# Patient Record
Sex: Male | Born: 1940 | Race: Black or African American | Hispanic: No | Marital: Married | Smoking: Former smoker
Health system: Southern US, Community
[De-identification: ages and names within clinical notes are randomized; demographics above are authoritative.]

## PROBLEM LIST (undated history)

## (undated) DIAGNOSIS — K648 Other hemorrhoids: Secondary | ICD-10-CM

## (undated) DIAGNOSIS — K222 Esophageal obstruction: Secondary | ICD-10-CM

## (undated) DIAGNOSIS — M1990 Unspecified osteoarthritis, unspecified site: Secondary | ICD-10-CM

## (undated) DIAGNOSIS — H269 Unspecified cataract: Secondary | ICD-10-CM

## (undated) DIAGNOSIS — K449 Diaphragmatic hernia without obstruction or gangrene: Secondary | ICD-10-CM

## (undated) DIAGNOSIS — T7840XA Allergy, unspecified, initial encounter: Secondary | ICD-10-CM

## (undated) DIAGNOSIS — D573 Sickle-cell trait: Secondary | ICD-10-CM

## (undated) DIAGNOSIS — J309 Allergic rhinitis, unspecified: Secondary | ICD-10-CM

## (undated) DIAGNOSIS — K219 Gastro-esophageal reflux disease without esophagitis: Secondary | ICD-10-CM

## (undated) HISTORY — DX: Allergy, unspecified, initial encounter: T78.40XA

## (undated) HISTORY — DX: Gastro-esophageal reflux disease without esophagitis: K21.9

## (undated) HISTORY — DX: Allergic rhinitis, unspecified: J30.9

## (undated) HISTORY — DX: Other hemorrhoids: K64.8

## (undated) HISTORY — DX: Esophageal obstruction: K22.2

## (undated) HISTORY — DX: Sickle-cell trait: D57.3

## (undated) HISTORY — DX: Unspecified cataract: H26.9

## (undated) HISTORY — DX: Unspecified osteoarthritis, unspecified site: M19.90

## (undated) HISTORY — DX: Diaphragmatic hernia without obstruction or gangrene: K44.9

---

## 2001-11-03 ENCOUNTER — Encounter: Payer: Self-pay | Admitting: *Deleted

## 2001-11-03 ENCOUNTER — Encounter: Admission: RE | Admit: 2001-11-03 | Discharge: 2001-11-03 | Payer: Self-pay | Admitting: *Deleted

## 2001-12-13 ENCOUNTER — Encounter (INDEPENDENT_AMBULATORY_CARE_PROVIDER_SITE_OTHER): Payer: Self-pay | Admitting: Specialist

## 2001-12-13 ENCOUNTER — Ambulatory Visit (HOSPITAL_COMMUNITY): Admission: RE | Admit: 2001-12-13 | Discharge: 2001-12-13 | Payer: Self-pay | Admitting: Gastroenterology

## 2002-02-21 ENCOUNTER — Ambulatory Visit (HOSPITAL_COMMUNITY): Admission: RE | Admit: 2002-02-21 | Discharge: 2002-02-21 | Payer: Self-pay | Admitting: Gastroenterology

## 2002-08-27 ENCOUNTER — Encounter: Payer: Self-pay | Admitting: Emergency Medicine

## 2002-08-27 ENCOUNTER — Emergency Department (HOSPITAL_COMMUNITY): Admission: EM | Admit: 2002-08-27 | Discharge: 2002-08-27 | Payer: Self-pay | Admitting: Emergency Medicine

## 2004-04-26 ENCOUNTER — Emergency Department (HOSPITAL_COMMUNITY): Admission: EM | Admit: 2004-04-26 | Discharge: 2004-04-26 | Payer: Self-pay | Admitting: Emergency Medicine

## 2006-04-10 ENCOUNTER — Emergency Department (HOSPITAL_COMMUNITY): Admission: EM | Admit: 2006-04-10 | Discharge: 2006-04-10 | Payer: Self-pay | Admitting: Emergency Medicine

## 2006-05-10 ENCOUNTER — Ambulatory Visit: Payer: Self-pay | Admitting: Family Medicine

## 2006-08-15 ENCOUNTER — Ambulatory Visit: Payer: Self-pay | Admitting: Family Medicine

## 2006-11-22 HISTORY — PX: COLONOSCOPY: SHX174

## 2006-11-22 LAB — HM COLONOSCOPY: HM Colonoscopy: NORMAL

## 2006-12-14 ENCOUNTER — Ambulatory Visit: Payer: Self-pay | Admitting: Family Medicine

## 2006-12-19 ENCOUNTER — Ambulatory Visit: Payer: Self-pay | Admitting: Family Medicine

## 2006-12-20 ENCOUNTER — Encounter: Admission: RE | Admit: 2006-12-20 | Discharge: 2006-12-20 | Payer: Self-pay | Admitting: Family Medicine

## 2006-12-23 ENCOUNTER — Ambulatory Visit: Payer: Self-pay | Admitting: Gastroenterology

## 2006-12-23 ENCOUNTER — Ambulatory Visit: Payer: Self-pay | Admitting: Family Medicine

## 2006-12-26 ENCOUNTER — Ambulatory Visit: Payer: Self-pay | Admitting: Cardiology

## 2007-01-10 ENCOUNTER — Encounter: Payer: Self-pay | Admitting: Family Medicine

## 2007-01-10 ENCOUNTER — Ambulatory Visit: Payer: Self-pay | Admitting: Gastroenterology

## 2007-01-10 DIAGNOSIS — K222 Esophageal obstruction: Secondary | ICD-10-CM

## 2007-01-10 DIAGNOSIS — K219 Gastro-esophageal reflux disease without esophagitis: Secondary | ICD-10-CM

## 2007-01-10 DIAGNOSIS — K449 Diaphragmatic hernia without obstruction or gangrene: Secondary | ICD-10-CM

## 2007-01-10 DIAGNOSIS — K648 Other hemorrhoids: Secondary | ICD-10-CM

## 2007-01-10 HISTORY — DX: Diaphragmatic hernia without obstruction or gangrene: K44.9

## 2007-01-10 HISTORY — DX: Gastro-esophageal reflux disease without esophagitis: K21.9

## 2007-01-10 HISTORY — DX: Esophageal obstruction: K22.2

## 2007-01-10 HISTORY — DX: Other hemorrhoids: K64.8

## 2007-01-16 ENCOUNTER — Ambulatory Visit: Payer: Self-pay | Admitting: Family Medicine

## 2007-06-15 ENCOUNTER — Ambulatory Visit: Payer: Self-pay | Admitting: Family Medicine

## 2008-07-24 IMAGING — US US ABDOMEN COMPLETE
1 series · 13 of 25 positions shown · non-contrast
Comparison: [REDACTED] abdominal radiographs 04/26/04.

CLINICAL DATA: Abdominal pain. 
COMPLETE ABDOMINAL ULTRASOUND:
TECHNIQUE: Complete abdominal ultrasound examination was performed including evaluation of the liver, gallbladder, bile ducts, pancreas, kidneys, spleen, IVC, and abdominal aorta.

[Series 1: unknown · 0.38mm/px · 13 of 106 slices shown]
[im 1/106]
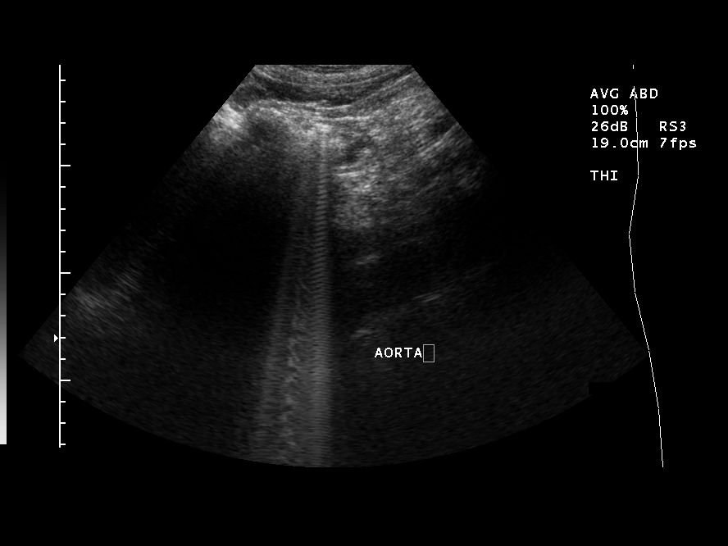
[im 9/106]
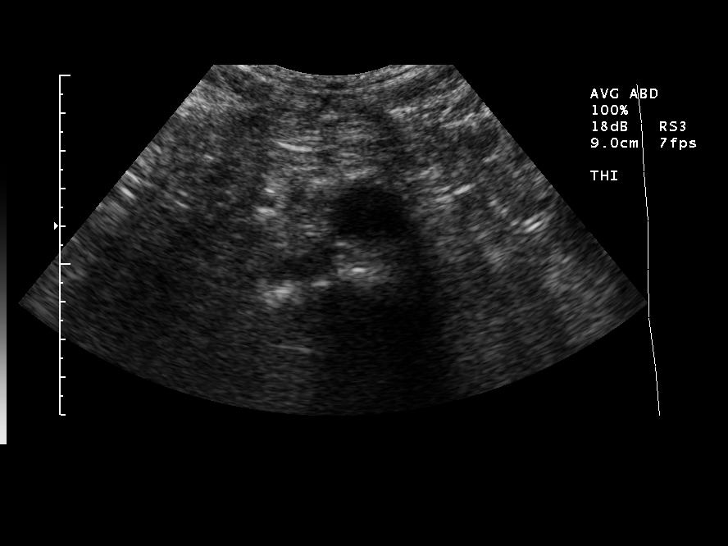
[im 18/106]
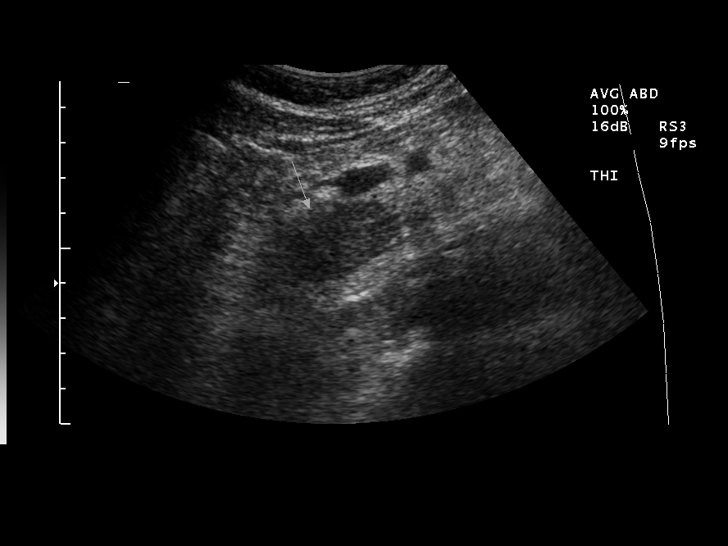
[im 27/106]
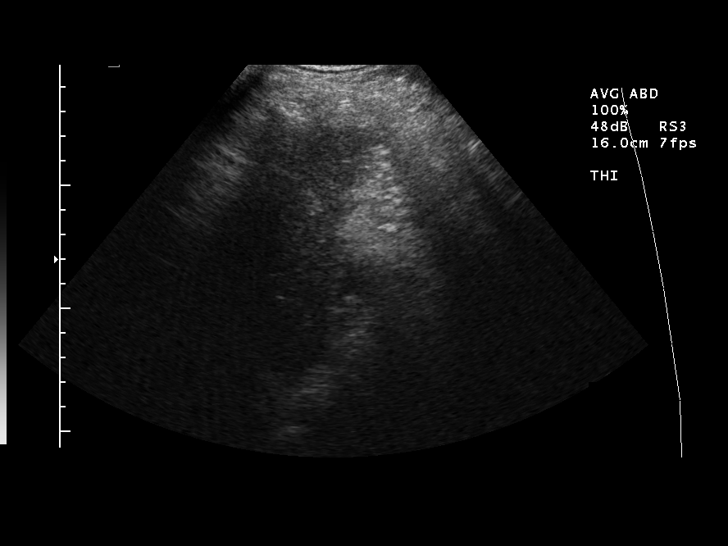
[im 36/106]
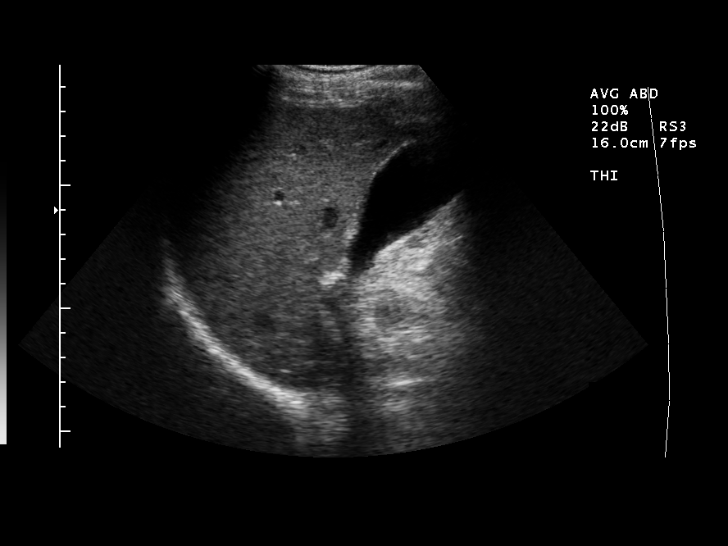
[im 44/106]
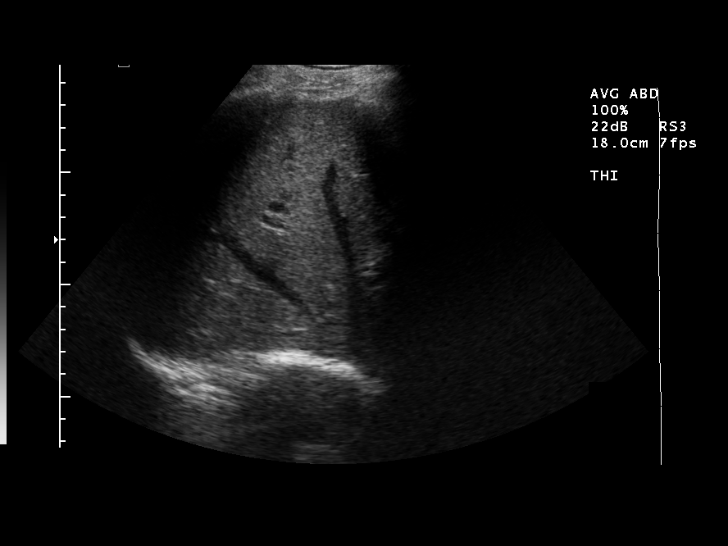
[im 53/106]
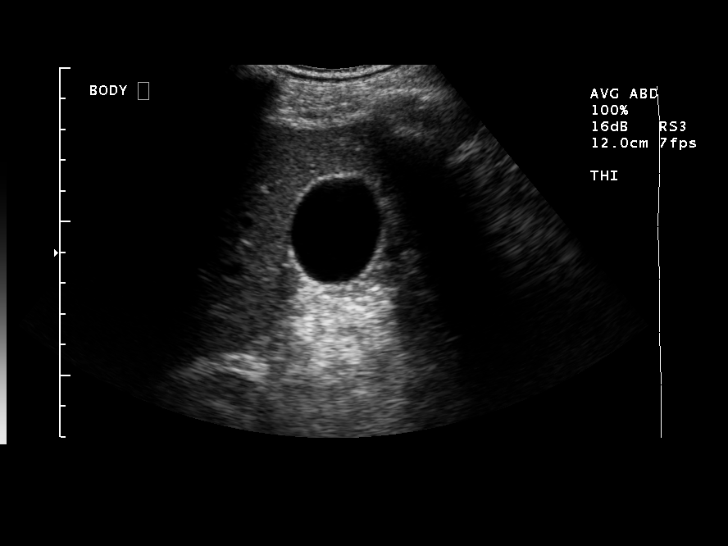
[im 62/106]
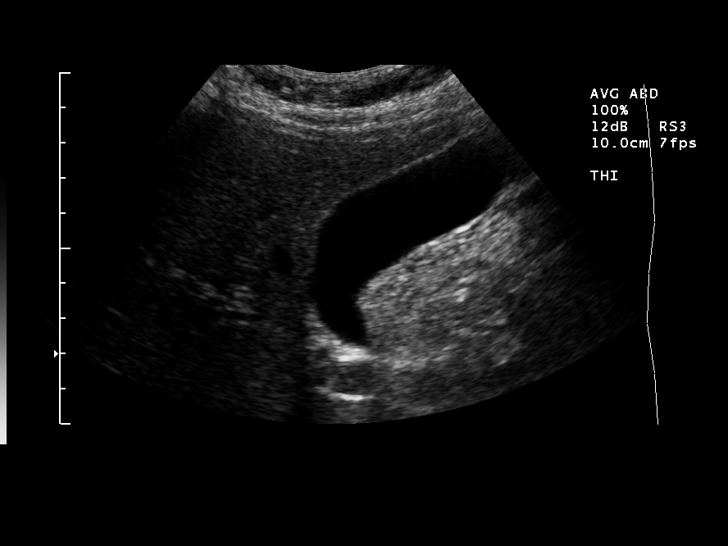
[im 71/106]
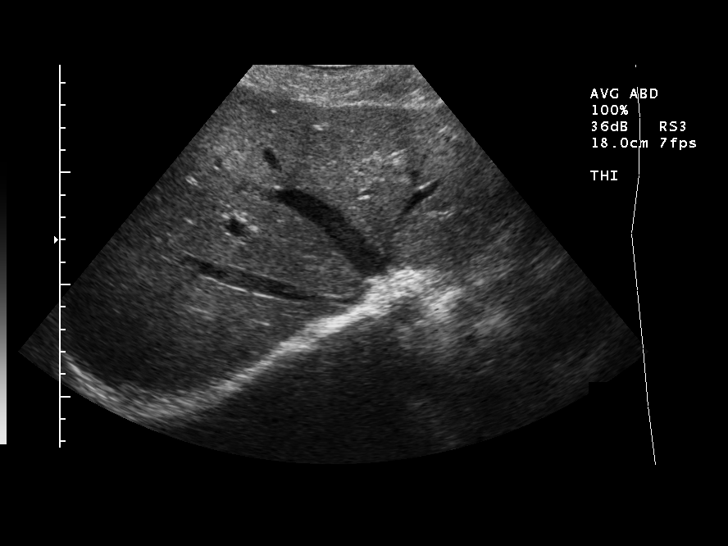
[im 79/106]
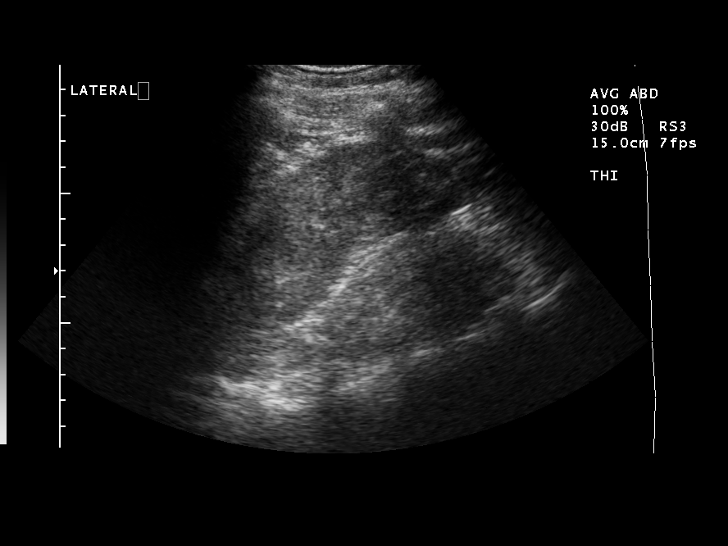
[im 88/106]
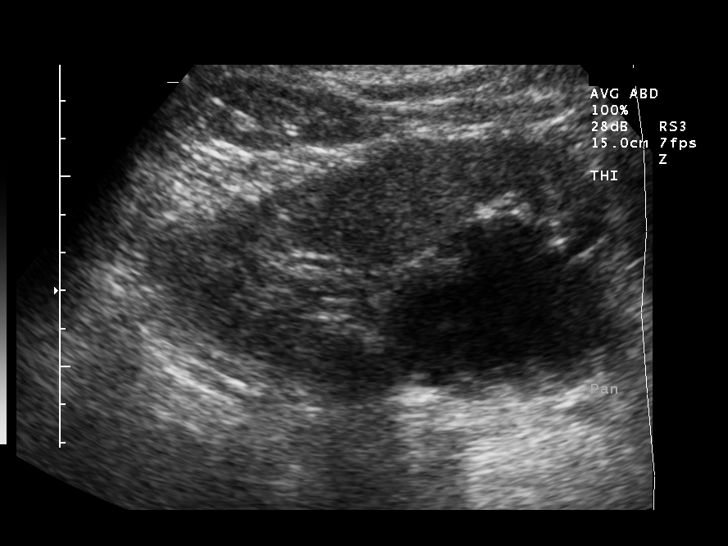
[im 97/106]
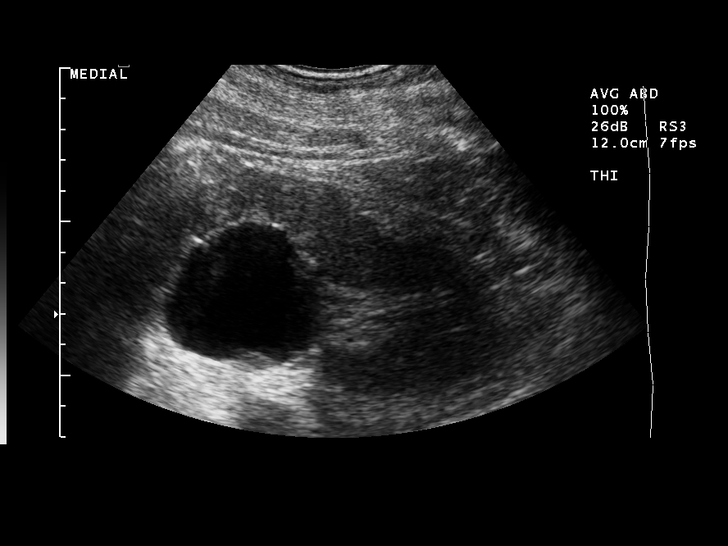
[im 106/106]
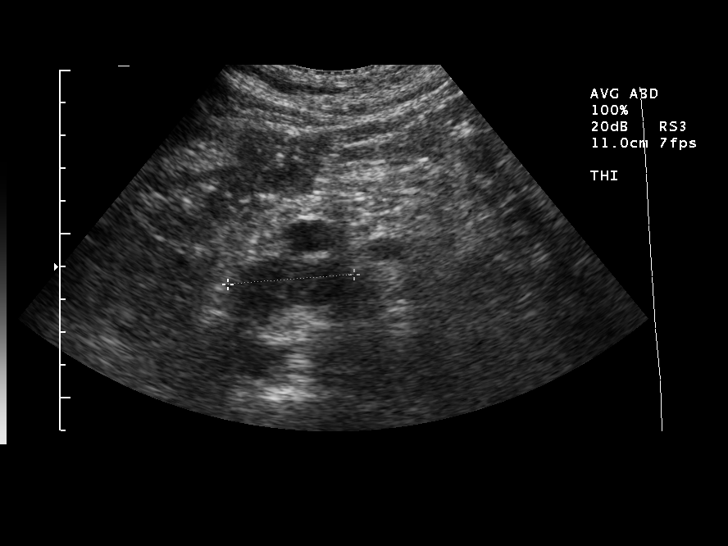

[13 of 25 positions shown; findings below may reference images not displayed]

FINDINGS: Gallbladder appears sonographically normal without sludge nor gallstones with normal wall thickness of 2mm.  No dilated intrahepatic nor extrahepatic bile ducts are seen with the common bile duct measuring normally at 4mm.  Liver, inferior vena cava, right kidney (11cm long) appears sonographically normal.  Posterior to the pancreatic head is hypoechoic focus measuring 4.7cm long x 3cm AP x 3.1cm wide.  Differential diagnosis includes retroperitoneal peripancreatic adenopathy, retroperitoneal soft tissue mass, and fluid filled duodenum or duodenal diverticulum persisting on delayed imaging (recommend abdominal CT for further evaluation due to non specificity on current abdominal ultrasound).  The pancreas is diffusely hyperechoic consistent with slight fatty atrophy and otherwise unremarkable.  Spleen is small measuring 6.8cm long and otherwise unremarkable.  A medial large left renal septated slightly complex cyst measures 7.1cm long x 4.8cm AP x 5.6cm wide favoring benign etiology, yet abdominal CT will confirm.  Left kidney measures 12.4cm long.  No hydronephrosis is seen.  Abdominal aorta is limited for visualization with proximal maximum diameter measuring upper limits of normal at 3.2cm.
IMPRESSION: 1.  4.7 x 3 x 3.1cm retroperitoneal focus posterior to the pancreatic head ? recommend abdominal CT for further evaluation for assessment of possible retroperitoneal adenopathy or other retroperitoneal lesion. 
2.  Small spleen. 
3.  Probable benign slightly complex 7.1cm left renal cystic focus (Abdominal CT will confirm). 
4.  Limited assessment of abdominal aorta with proximal dimension upper limits of normal.  
5.  Otherwise negative.

## 2008-08-01 ENCOUNTER — Ambulatory Visit: Payer: Self-pay | Admitting: Family Medicine

## 2009-07-31 ENCOUNTER — Ambulatory Visit: Payer: Self-pay | Admitting: Family Medicine

## 2010-03-06 ENCOUNTER — Ambulatory Visit: Payer: Self-pay | Admitting: Family Medicine

## 2010-10-14 ENCOUNTER — Ambulatory Visit: Payer: Self-pay | Admitting: Family Medicine

## 2010-12-24 NOTE — Procedures (Signed)
Summary: Colonoscopy Report/[facility]  Colonoscopy Report/[facility]   Imported By: Maryln Gottron 05/14/2010 14:58:02  _____________________________________________________________________  External Attachment:    Type:   Image     Comment:   External Document

## 2011-04-09 NOTE — Procedures (Signed)
[place]. [facility]  Patient:    Joseph West, Joseph West Visit Number: [id number] MRN: [id number]          Service Type: END Location: ENDO Attending Physician:  Charna Elizabeth Dictated by:   Anselmo Rod, M.D. Proc. Date: 02/21/02 Admit Date:  02/21/2002 Discharge Date: 02/21/2002   CC:         Heather Roberts, M.D.   Procedure Report  DATE OF BIRTH:  Nov 15, 1941  REFERRING PHYSICIAN:  Heather Roberts, M.D.  PROCEDURE PERFORMED:  Colonoscopy.  ENDOSCOPIST:  Anselmo Rod, M.D.  INSTRUMENT USED:  Olympus video colonoscope.  INDICATIONS FOR PROCEDURE:  Rectal bleeding in a 70 year old African-American male.  Rule out colonic polyps, masses, hemorrhoids, etc.  PREPROCEDURE PREPARATION:  Informed consent was procured from the patient. The patient was fasted for eight hours prior to the procedure and prepped with a bottle of magnesium citrate and a gallon of NuLytely the night prior to the procedure.  PREPROCEDURE PHYSICAL:  The patient had stable vital signs.  Neck supple. Chest clear to auscultation.  S1, S2 regular.  Abdomen soft with normal bowel sounds.  DESCRIPTION OF PROCEDURE:  The patient was placed in the left lateral decubitus position and sedated with 60 mg of Demerol and 7 mg of Versed intravenously.  Once the patient was adequately sedated and maintained on low-flow oxygen and continuous cardiac monitoring, the Olympus video colonoscope was advanced from the rectum to the cecum without difficulty. Except for small internal hemorrhoids, no other abnormalities were noted.  No masses, polyps, erosions, ulcerations or diverticula were present.  There was some residual stool in the right colon.  Multiple washes were done.  The appendiceal orifice and ileocecal valve were clearly visualized and photographed.  IMPRESSION:  Healthy-appearing colon up to the cecum except for small internal hemorrhoids.  No masses or polyps seen.  No  evidence of diverticulosis.  RECOMMENDATIONS: 1. The patient has been advised to increase the fluid and fiber in his diet. 2. Anusol HC 2.5% suppositories are to be used if the rectal bleeding    continues. 3. Outpatient follow-up in the next two weeks. 4. Repeat colorectal cancer screening in the next five years unless the    patient were to develop any abnormal symptoms in the interim.Dictated by: Anselmo Rod, M.D. Attending Physician:  Charna Elizabeth DD:  02/21/02 TD:  02/21/02 Job: [id number]

## 2011-04-09 NOTE — Procedures (Signed)
[place]. [facility]  Patient:    Joseph West, Joseph West Visit Number: [id number] MRN: [id number]          Service Type: END Location: ENDO Attending Physician:  Charna Elizabeth Dictated by:   Anselmo Rod, M.D. Proc. Date: 12/14/01 Admit Date:  12/13/2001   CC:         Heather Roberts, M.D.   Procedure Report  DATE OF BIRTH:  [birth year]  PROCEDURE PERFORMED:  Esophagogastroduodenoscopy with Savary dilatation of a Schatzki ring.  ENDOSCOPIST:  Anselmo Rod, M.D.  INSTRUMENT USED:  Olympus video panendoscope and Savary dilators.  INDICATIONS:  Dysphagia in a 70 year old African-American male, Schatzki ring seen on a barium swallow.  Dilatation planned.  PREPROCEDURE PREPARATION:  Informed consent was procured from the patient. The patient was fasted for 8 hours prior to the procedure and asked to avoid all nonsteroidals prior to the procedure.  PREPROCEDURE PHYSICAL:  Patient has stable vital signs.  NECK:  Supple.  CHEST:  Clear to auscultation. S1, S2 regular.  ABDOMEN:  Soft with normal bowel sounds.  DESCRIPTION OF PROCEDURE:  The patient was placed in the left lateral decubitus position and sedated with 50 mg of Demerol and 5 mg of Versed intravenously.  Once the patient was adequately sedated and maintained on low-flow oxygen and continuous cardiac monitoring, the Olympus video panendoscope was advanced through the mouth piece, over the tongue into the esophagus under direct vision.  There was a Schatzki ring seen at the Z-line. This was dilated with Savary dilators over the guidewire in a routine fashion. Sizes 14, 16, and 18 mm dilators were used.  On further advancing the scope into the stomach, there was evidence of moderate diffuse gastritis with antral erosions.  Biopsies were done from the antrum to rule out presence of Helicobacter pylori by pathology.  A small hiatal hernia was seen on high retroflexion.  There was moderate  duodenitis in the duodenal bulb.  The small bowel distal to the bulb appeared normal.  IMPRESSION: 1. Schatzki ring dilated with Savary dilators. 2. Moderate diffuse gastritis with antral erosions, biopsies done for    Helicobacter pylori, results pending. 3. Small hiatal hernia. 4. Moderate duodenitis in duodenal bulb. 5. Normal small bowel distal to the bulb.  RECOMMENDATIONS: 1. Prevacid 30 mg one p.o. q.d. has been recommended for the patient.    Samples have been given to him from the office for the next four weeks. 2. Liberal fluid intake along with meals have been advised.  Small bites with    adequate chewing has been recommended for the patient. 3. Outpatient follow-up was advised within the next two weeks. Dictated by:   Anselmo Rod, M.D. Attending Physician:  Charna Elizabeth DD:  12/14/01 TD:  12/14/01 Job: [id number]

## 2012-05-22 ENCOUNTER — Encounter: Payer: Self-pay | Admitting: Medical

## 2012-05-22 ENCOUNTER — Ambulatory Visit
Admission: RE | Admit: 2012-05-22 | Discharge: 2012-05-22 | Disposition: A | Discharge status: Home / Self Care | Payer: BC Managed Care – PPO | Source: Ambulatory Visit | Attending: Medical | Admitting: Medical

## 2012-05-22 ENCOUNTER — Ambulatory Visit (INDEPENDENT_AMBULATORY_CARE_PROVIDER_SITE_OTHER): Payer: BC Managed Care – PPO | Admitting: Medical

## 2012-05-22 VITALS — BP 140/80 | HR 92 | Temp 98.4°F | Resp 16 | Wt 196.0 lb

## 2012-05-22 DIAGNOSIS — R109 Unspecified abdominal pain: Secondary | ICD-10-CM

## 2012-05-22 DIAGNOSIS — K5900 Constipation, unspecified: Secondary | ICD-10-CM

## 2012-05-22 DIAGNOSIS — R198 Other specified symptoms and signs involving the digestive system and abdomen: Secondary | ICD-10-CM

## 2012-05-22 LAB — CBC WITH DIFFERENTIAL/PLATELET
Basophils Relative: 1 % (ref 0–1)
Eosinophils Absolute: 0.3 10*3/uL (ref 0.0–0.7)
Eosinophils Relative: 4 % (ref 0–5)
Hemoglobin: 15.7 g/dL (ref 13.0–17.0)
Lymphs Abs: 3.6 10*3/uL (ref 0.7–4.0)
MCH: 29 pg (ref 26.0–34.0)
MCHC: 34.4 g/dL (ref 30.0–36.0)
MCV: 84.1 fL (ref 78.0–100.0)
Monocytes Relative: 6 % (ref 3–12)
Neutrophils Relative %: 43 % (ref 43–77)
Platelets: 281 10*3/uL (ref 150–400)

## 2012-05-22 NOTE — Progress Notes (Signed)
Subjective: Here for c/o abdominal pain.  He reports starting last Thursday after playing golf, had fullness and discomfort in abdomen.  He had eaten some food at [facility] and thought something didn't sit right.  Has had fullness and discomfort in abdomen as well as occasional nausea for the last few days.  Last night used an enema which cleaned him out and relieved some pressure, but then had same fullness and discomfort again this morning.  Denies fever, blood in stool, no urinary concerns.  Last colonoscopy several years ago, due back 2013 for repeat.  No other symptoms, no recent weight loss.  No other aggravating or relieving factors.    No other c/o.  The following portions of the patient's history were reviewed and updated as appropriate: allergies, current medications, past family history, past medical history, past social history, past surgical history and problem list.  No past medical history on file.  No Known Allergies   Review of Systems ROS reviewed and was negative other than noted in HPI or above.    Objective:   Physical Exam  General appearance: alert, no distress, WD/WN, AA male, pleasant Neck: supple, no lymphadenopathy, no thyromegaly, no masses Heart: RRR, normal S1, S2, no murmurs Lungs: CTA bilaterally, no wheezes, rhonchi, or rales Abdomen: +bs, soft, mild tenderness in upper portions of abdomen across in bandlike pattern, but no lower abdominal tenderness, non distended, no masses, no hepatomegaly, no splenomegaly Back: nontender Pulses: 2+ symmetric Rectal: anus normal appearing, prostate WNL, occult negative stool  Assessment and Plan :     Encounter Diagnoses  Name Primary?  . Abdominal pain Yes  . Change in bowel function   . Constipation    KUB xray and labs.  Discussed his concerns, symptoms, advised he drink plenty of water, eat plenty of fiber in general, but we will call with results and plan.

## 2012-05-23 ENCOUNTER — Telehealth: Payer: Self-pay | Admitting: Medical

## 2012-05-23 LAB — COMPREHENSIVE METABOLIC PANEL
ALT: <8 U/L (ref 0–53)
BUN: 14 mg/dL (ref 6–23)
CO2: 23 mEq/L (ref 19–32)
Calcium: 9.7 mg/dL (ref 8.4–10.5)
Chloride: 107 mEq/L (ref 96–112)
Creat: 1.27 mg/dL (ref 0.50–1.35)
Total Bilirubin: 0.6 mg/dL (ref 0.3–1.2)

## 2012-05-23 LAB — TSH: TSH: 0.872 u[IU]/mL (ref 0.350–4.500)

## 2012-05-24 ENCOUNTER — Telehealth: Payer: Self-pay | Admitting: Family Medicine

## 2012-05-24 ENCOUNTER — Encounter: Payer: Self-pay | Admitting: Gastroenterology

## 2012-05-24 NOTE — Telephone Encounter (Signed)
Patient was made aware of his appointment by phone 05/24/12. CLS   Dr. Jarold Motto [phone] June 01, 2012 @ 830 am

## 2012-05-29 ENCOUNTER — Encounter: Payer: Self-pay | Admitting: Family Medicine

## 2012-05-29 ENCOUNTER — Ambulatory Visit (INDEPENDENT_AMBULATORY_CARE_PROVIDER_SITE_OTHER): Payer: BC Managed Care – PPO | Admitting: Family Medicine

## 2012-05-29 VITALS — BP 144/90 | HR 88 | Wt 199.0 lb

## 2012-05-29 DIAGNOSIS — K3189 Other diseases of stomach and duodenum: Secondary | ICD-10-CM

## 2012-05-29 DIAGNOSIS — K319 Disease of stomach and duodenum, unspecified: Secondary | ICD-10-CM

## 2012-05-29 MED ORDER — HYOSCYAMINE SULFATE ER 0.375 MG PO TB12: 0.3750 mg | ORAL_TABLET | Freq: Two times a day (BID) | ORAL | Status: DC | PRN

## 2012-05-29 NOTE — Progress Notes (Signed)
  Subjective:    Patient ID: Joseph West, male    DOB: 04-22-1941, 71 y.o.   MRN: [id number]  HPI He is here for evaluation of another episode of abdominal pain. This occurred after eating a meal consisting of salmon and broccoli. Approximately half an hour after he ate, he developed some cramping abdominal pain. He had no nausea, vomiting or diarrhea. It lasted approximately one hour.   Review of Systems     Objective:   Physical Exam Alert and in no distress. Cardiac exam shows regular rhythm without murmurs gallops. Lungs clear to auscultation. Abdominal exam shows slight midepigastric tenderness.       Assessment & Plan:   1. Spasm of GI tract  hyoscyamine (LEVBID) 0.375 MG 12 hr tablet   he is to let me know how this works.: Hold off on the GI appointment which she was planning to do anyway due to a trip to the beach.

## 2012-05-29 NOTE — Telephone Encounter (Signed)
TSD  

## 2012-06-01 ENCOUNTER — Ambulatory Visit: Payer: Medicare Other | Admitting: Gastroenterology

## 2013-01-18 ENCOUNTER — Encounter: Payer: Self-pay | Admitting: Family Medicine

## 2013-01-30 ENCOUNTER — Ambulatory Visit (INDEPENDENT_AMBULATORY_CARE_PROVIDER_SITE_OTHER): Payer: Medicare Other | Admitting: Family Medicine

## 2013-01-30 ENCOUNTER — Encounter: Payer: Self-pay | Admitting: Family Medicine

## 2013-01-30 VITALS — BP 140/90 | HR 79 | Ht 71.0 in | Wt 208.0 lb

## 2013-01-30 DIAGNOSIS — J301 Allergic rhinitis due to pollen: Secondary | ICD-10-CM | POA: Insufficient documentation

## 2013-01-30 DIAGNOSIS — M129 Arthropathy, unspecified: Secondary | ICD-10-CM

## 2013-01-30 DIAGNOSIS — J309 Allergic rhinitis, unspecified: Secondary | ICD-10-CM

## 2013-01-30 DIAGNOSIS — Z0000 Encounter for general adult medical examination without abnormal findings: Secondary | ICD-10-CM

## 2013-01-30 DIAGNOSIS — Z136 Encounter for screening for cardiovascular disorders: Secondary | ICD-10-CM

## 2013-01-30 LAB — CBC WITH DIFFERENTIAL/PLATELET
Eosinophils Absolute: 0.4 10*3/uL (ref 0.0–0.7)
HCT: 42.8 % (ref 39.0–52.0)
Hemoglobin: 14.1 g/dL (ref 13.0–17.0)
Lymphs Abs: 4.1 10*3/uL — ABNORMAL HIGH (ref 0.7–4.0)
MCH: 28.4 pg (ref 26.0–34.0)
Monocytes Relative: 7 % (ref 3–12)
Neutrophils Relative %: 36 % — ABNORMAL LOW (ref 43–77)
RBC: 4.97 MIL/uL (ref 4.22–5.81)

## 2013-01-30 NOTE — Progress Notes (Signed)
  Subjective:    Patient ID: Joseph West, male    DOB: 18-Oct-1941, 72 y.o.   MRN: [id number]  HPI He is here for complete examination. He does have underlying seasonal allergies and presently is not taking any medications for these. He does have arthritis however he is involved in an exercise program through [employer]. He has a history of hiatus hernia as well as Schatzki's ring and some reflux symptoms. Presently is not taking anything.in general life is going quite well for him. His marriage is going well. Social and family history were reviewed. He has started smoking again but does plan to quit especially since he is now exercising regularly   Review of Systems  Constitutional: Negative.   HENT: Negative.   Eyes: Negative.   Respiratory: Negative.   Cardiovascular: Negative.   Gastrointestinal: Negative.   Endocrine: Negative.   Genitourinary: Negative.   Musculoskeletal: Negative.   Allergic/Immunologic: Negative.   Neurological: Negative.   Hematological: Negative.   Psychiatric/Behavioral: Negative.        Objective:   Physical Exam alert and in no distress. Tympanic membranes and canals are normal. Throat is clear. Tonsils are normal. Neck is supple without adenopathy or thyromegaly. Cardiac exam shows a regular sinus rhythm without murmurs or gallops. Lungs are clear to auscultation.abdominal exam shows no hepatosplenomegaly masses or tenderness. Genital and rectal deferred.        Assessment & Plan:  Routine general medical examination at a health care facility - Plan: Tdap vaccine greater than or equal to 7yo IM, Lipid panel, CBC with Differential, Comprehensive metabolic panel  Screening for cardiovascular condition - Plan: Lipid panel  GERD (gastroesophageal reflux disease)  Allergic rhinitis, seasonal  Arthritis I encouraged him to continue to take good care of himself. His immunizations were updated. Routine blood screening ordered.

## 2013-01-31 LAB — LIPID PANEL
Cholesterol: 187 mg/dL (ref 0–200)
VLDL: 14 mg/dL (ref 0–40)

## 2013-01-31 LAB — COMPREHENSIVE METABOLIC PANEL
Albumin: 3.9 g/dL (ref 3.5–5.2)
CO2: 29 mEq/L (ref 19–32)
Glucose, Bld: 91 mg/dL (ref 70–99)
Potassium: 4.8 mEq/L (ref 3.5–5.3)
Sodium: 143 mEq/L (ref 135–145)
Total Bilirubin: 0.3 mg/dL (ref 0.3–1.2)
Total Protein: 7.1 g/dL (ref 6.0–8.3)

## 2013-05-16 ENCOUNTER — Ambulatory Visit (INDEPENDENT_AMBULATORY_CARE_PROVIDER_SITE_OTHER): Payer: Medicare Other | Admitting: Medical

## 2013-05-16 ENCOUNTER — Encounter: Payer: Self-pay | Admitting: Medical

## 2013-05-16 VITALS — BP 148/90 | HR 84 | Temp 98.0°F | Resp 16 | Wt 202.0 lb

## 2013-05-16 DIAGNOSIS — R030 Elevated blood-pressure reading, without diagnosis of hypertension: Secondary | ICD-10-CM

## 2013-05-16 DIAGNOSIS — R55 Syncope and collapse: Secondary | ICD-10-CM

## 2013-05-16 DIAGNOSIS — E860 Dehydration: Secondary | ICD-10-CM

## 2013-05-16 NOTE — Progress Notes (Signed)
Subjective: He notes that he works out at [employer].  Last thursday walked 3 laps, then got on exercise bike for , then lap swam for 30 minutes.  Went in the sauna after this.  He went to his friends house and had only drank some tea.  He was at friend's house for about an hour.  His friend said he blacked out briefly.  Normally he eats after his work out but that day had just drank tea.  He fell out in the floor from the bar stool. After he came too was real sweaty.  Thinks he may have just over did it that day and was dehydrated.   After coming too drank Gatorade, ate cereal, felt fine.  No similar symptoms since.  He does note hx/o vertigo.  Since last week been not feeling quite his self.  No other symptoms.    Past Medical History  Diagnosis Date  . Hiatal hernia 01-10-2007    EGD  . GERD (gastroesophageal reflux disease) 01-10-2007    EGD  . Stricture and stenosis of esophagus 01-10-2007    EGD   . Internal hemorrhoids without mention of complication 01-10-2007    Colonoscopy  . Schatzki's ring   . Allergic rhinitis   . Arthritis    ROS as in subjective  Objective: Filed Vitals:   05/16/13 1055  BP: 148/90  Pulse: 84  Temp: 98 F (36.7 C)  Resp: 16    General appearance: alert, no distress, WD/WN HEENT: normocephalic, sclerae anicteric, TMs pearly, nares patent, no discharge or erythema, pharynx normal Oral cavity: MMM, no lesions Neck: supple, no lymphadenopathy, no thyromegaly, no masses, no JVD or bruits Heart: RRR, normal S1, S2, no murmurs Lungs: CTA bilaterally, no wheezes, rhonchi, or rales Abdomen: +bs, soft, non tender, non distended, no masses, no hepatomegaly, no splenomegaly Pulses: 2+ symmetric, upper and lower extremities, normal cap refill Ext: no edema   Adult ECG Report  Indication: syncope   Rate: 63 bpm  Rhythm: normal sinus rhythm  QRS Axis: 7 degrees  PR Interval:  QRS Duration:  QTc:  Conduction Disturbances: incomplete  RBBB  Other Abnormalities: none  Patient's cardiac risk factors are: advanced age (older than 37 for men, 64 for women) and male gender.  EKG comparison: 10/2001  Narrative Interpretation: P wave enlargement, no new changes    Assessment: Encounter Diagnoses  Name Primary?  . Dehydration Yes  . Syncope   . Elevated blood pressure reading without diagnosis of hypertension      Plan: Discussed that his episode last week was likely due to overexertion, dehydration, and possibly transient hypoglycemia.  discussed hydration, avoiding similar events in the future.  No other worrisome findings or history suggestive of any other problem currently.   discussed signs/symptoms of acute coronary syndrome, CVA that would prompt 911 call.  Start checking BP.  If symptoms recur or other new symptoms, then recheck.   Follow-up 3-4 weeks with BP readings.

## 2013-05-28 ENCOUNTER — Encounter: Payer: Self-pay | Admitting: Family Medicine

## 2013-05-28 ENCOUNTER — Ambulatory Visit (INDEPENDENT_AMBULATORY_CARE_PROVIDER_SITE_OTHER): Payer: Medicare Other | Admitting: Family Medicine

## 2013-05-28 VITALS — BP 140/85 | HR 86 | Wt 203.0 lb

## 2013-05-28 DIAGNOSIS — IMO0001 Reserved for inherently not codable concepts without codable children: Secondary | ICD-10-CM

## 2013-05-28 DIAGNOSIS — R030 Elevated blood-pressure reading, without diagnosis of hypertension: Secondary | ICD-10-CM

## 2013-05-28 NOTE — Patient Instructions (Signed)
Check her blood pressure once per month maybe

## 2013-05-28 NOTE — Progress Notes (Signed)
  Subjective:    Patient ID: Joseph West, male    DOB: [birth year], 72 y.o.   MRN: [id number]  HPI He is here for blood pressure check. He has been [place] it checked [place] the fire station. The record is run between 160/80 to 186/106.   Review of Systems     Objective:   Physical Exam Alert and in no distress. Blood pressure by me here was 140/85       Assessment & Plan:  Elevated blood pressure  I recommended that he check his blood pressure usually just once per month. If he gets a blood pressure cuff of his own, he should bring it in here and measured against ours. Encouraged him to make sure that he does breakfast before he works out and that he checks his blood pressure in the resting sitting position with the arm at heart level.

## 2013-09-04 ENCOUNTER — Ambulatory Visit (INDEPENDENT_AMBULATORY_CARE_PROVIDER_SITE_OTHER): Payer: Medicare Other | Admitting: Family Medicine

## 2013-09-04 ENCOUNTER — Encounter: Payer: Self-pay | Admitting: Family Medicine

## 2013-09-04 VITALS — BP 122/78 | HR 96 | Wt 209.0 lb

## 2013-09-04 DIAGNOSIS — M25512 Pain in left shoulder: Secondary | ICD-10-CM

## 2013-09-04 DIAGNOSIS — Z23 Encounter for immunization: Secondary | ICD-10-CM

## 2013-09-04 DIAGNOSIS — M25519 Pain in unspecified shoulder: Secondary | ICD-10-CM

## 2013-09-04 NOTE — Patient Instructions (Signed)
Take 4 ibuprofen 3 times per day for the next 10 days and see if this will calm it down. It does not,come on back and I'll reevaluate and possibly give you an injection

## 2013-09-04 NOTE — Progress Notes (Signed)
  Subjective:    Patient ID: Joseph West, male    DOB: 1941-03-11, 72 y.o.   MRN: [id number]  HPI He is here for evaluation of left shoulder pain. He has been exercising regularly since January with no change in his routine over 2 weeks ago he noted left shoulder pain after he finishes his swimming routine. He does freestyle and breast stroke. No particular activity tends to make this worse.   Review of Systems     Objective:   Physical Exam Full range of motion of the shoulder. No palpable tenderness noted. No laxity noted. Drop arm test negative. Neer's and Hawkins test negative.       Assessment & Plan:  Left shoulder pain  Need for prophylactic vaccination and inoculation against influenza - Plan: Flu vaccine HIGH DOSE PF (Fluzone Tri High dose)  recommend NSAID regularly for the next 10 days. If continued difficulty history turned here for reevaluation of possible injection. This is probably a bursitis or an overuse type injury. Flu shot given with risks and benefits discussed.

## 2014-04-22 ENCOUNTER — Encounter: Payer: Self-pay | Admitting: Family Medicine

## 2014-04-22 ENCOUNTER — Ambulatory Visit (INDEPENDENT_AMBULATORY_CARE_PROVIDER_SITE_OTHER): Payer: Medicare Other | Admitting: Family Medicine

## 2014-04-22 VITALS — BP 126/86 | HR 80 | Temp 98.4°F | Ht 71.5 in | Wt 198.0 lb

## 2014-04-22 DIAGNOSIS — K219 Gastro-esophageal reflux disease without esophagitis: Secondary | ICD-10-CM

## 2014-04-22 DIAGNOSIS — R1012 Left upper quadrant pain: Secondary | ICD-10-CM

## 2014-04-22 NOTE — Progress Notes (Signed)
Chief Complaint  Patient presents with  . Pain    under his rib on the left side only since last Thursday-pain comes and goes. Is no worse when he takes a deep breath. Took a laxative and had a bowel movement x 3 with last one being diarrhea-like. (Saturday evening) Has not had another BM since these 3.    Pain in LUQ started 4 days ago.  He thinks it started after eating corn, although hasn't had problems eating corn in the past.  He cannot reproduce the pain with any position change or stretch. He describes the pain as a pressure, constant, up to 7-8/10 in pain.  It hurts more to lie on the left side, pain is more noticeable.  He doesn't feel it lying on his back.  He had been feeling constipated, so he took a laxative 3 days ago.  The following day he had a few stools (the last of the three times was very watery).  He hasn't had a stool since then.  Pain got slightly better since having the bowel movements.  He goes to the bathroom feeling like he has to have a bowel movement, but has just been passing a lot of gas. Pain diminishes after passing gas. Denies any blood or mucus in the stool.  Pain isn't related to eating.  He hasn't had much of an appetite since the pain started.    He swims at the gym every morning, and plays golf. He hasn't had pain or discomfort limiting his activities.  He had some pain going up his chest when he drank water after playing golf last week.  He realizes that he was only taking 2 zantac in the morning, but not the evening doses. Doing that for about the last 2 months. He just restarted the evening dose yesterday.  Denies dysphagia.   Past Medical History  Diagnosis Date  . Hiatal hernia 01-10-2007    EGD  . GERD (gastroesophageal reflux disease) 01-10-2007    EGD  . Stricture and stenosis of esophagus 01-10-2007    EGD   . Internal hemorrhoids without mention of complication [id number]    Colonoscopy  . Schatzki's ring   . Allergic rhinitis   . Arthritis     Past Surgical History  Procedure Laterality Date  . Colonoscopy  2008    Dr. Sharlett Iles   History   Social History  . Marital Status: Married    Spouse Name: N/A    Number of Children: N/A  . Years of Education: N/A   Occupational History  . retired [employer]   Social History Main Topics  . Smoking status: Current Every Day Smoker -- 0.25 packs/day    Types: Cigarettes  . Smokeless tobacco: Never Used  . Alcohol Use: No  . Drug Use: No  . Sexual Activity: Not on file   Other Topics Concern  . Not on file   Social History Narrative  . No narrative on file   He is a smoker. He quit for 7 years, and started back about a year ago. He smoked for about 10 years.    Outpatient Encounter Prescriptions as of 04/22/2014  Medication Sig Note  . Multiple Vitamin (MULTIVITAMIN) capsule Take 1 capsule by mouth daily.   . ranitidine (ZANTAC) 75 MG tablet Take 150 mg by mouth 2 (two) times daily.  04/22/2014: Generally only takes first dose.   No Known Allergies  ROS: He is having some sweats at  night, occasional chills; no known fevers.  Denies cough, shortness of breath, URI symptoms, chest pain, palpitations.  +decreased appetite, no nausea/vomiting.  +constipation/diarrhea after laxative as per HPI.  +recurrent reflux symptoms (see HPI).  Denies bleeding/bruising, rash, urinary complaints or other concerns.   PHYSICAL EXAM: BP 126/86  Pulse 80  Temp(Src) 98.4 F (36.9 C) (Oral)  Ht 5' 11.5" (1.816 m)  Wt 198 lb (89.812 kg)  BMI 27.23 kg/m2  Well developed, pleasant male, appearing younger than stated age Neck: no lymphadenopathy or mass Heart: regular rate and rhythm without murmur Lungs: clear bilaterally Abdomen:  Normal bowel sounds. No hepatosplenomegaly.  He is tender just below the L lower ribs, in LUQ, in a very focal area.  No rebound tenderness, guarding or mass.  No pain or hernia elicited with sit-up. Back: no CVA tenderness Skin: no  rash Extremities: no edema Neuro: grossly normal cranial nerves, strength, gait Psych: normal mood, affect, hygiene and grooming  ASSESSMENT/PLAN:  LUQ abdominal pain  GERD (gastroesophageal reflux disease)   LUQ pain in a 73 yo male with known reflux, who has been underdosing his H2 blockers for the last 2 months with some recurrent heartburn.  He feels some improvement in LUQ pain after bowel movements and passing gas.  There is no reproducibility of pain with muscle contraction or positions, suggesting GI etiology, rather than muscular.  Discussed doing CBC, x-rays, more aggressive eval, but elect to hold off and do this if symptoms persist/worsen.  Increase the zantac to 150mg  twice daily.  Try taking simethicone (Gas-X) as needed for the pain.  Continue to ensure that you use the bathroom regularly--high fiber diet, stool softeners, if needed.  Consider using probiotics such as Align  Return if fevers, nausea, vomiting, if decreased appetite persists, especially if any weight loss.  Return if blood in the stool, worsening abdominal pain,, or other concerns. Use tylenol as needed for pain. Consider trial of heating pad  Encouraged to quit smoking

## 2014-04-22 NOTE — Patient Instructions (Signed)
  Increase the zantac to 150mg  twice daily.  Try taking simethicone (Gas-X) as needed for the pain.  Continue to ensure that you use the bathroom regularly--high fiber diet, stool softeners, if needed.  Consider using probiotics such as Align  Return if fevers, nausea, vomiting, if decreased appetite persists, especially if any weight loss.  Return if blood in the stool, worsening abdominal pain,, or other concerns. Use tylenol as needed for pain. Consider trial of heating pad

## 2014-04-29 ENCOUNTER — Encounter: Payer: Self-pay | Admitting: Family Medicine

## 2014-04-29 ENCOUNTER — Ambulatory Visit (INDEPENDENT_AMBULATORY_CARE_PROVIDER_SITE_OTHER): Payer: Medicare Other | Admitting: Family Medicine

## 2014-04-29 VITALS — BP 110/90 | Wt 199.0 lb

## 2014-04-29 DIAGNOSIS — K3189 Other diseases of stomach and duodenum: Secondary | ICD-10-CM

## 2014-04-29 DIAGNOSIS — K319 Disease of stomach and duodenum, unspecified: Secondary | ICD-10-CM

## 2014-04-29 MED ORDER — HYOSCYAMINE SULFATE ER 0.375 MG PO TB12: 0.3750 mg | ORAL_TABLET | Freq: Two times a day (BID) | ORAL | Status: DC

## 2014-04-29 NOTE — Progress Notes (Signed)
   Subjective:    Patient ID: Joseph West, male    DOB: [birth year], 73 y.o.   MRN: [id number]  HPI He is here for recheck. He has been on Zantac twice a day. He continues to have difficulty with left upper quadrant pain but now notes that food of any kind causes her cramping left upper abdominal pain but no associated nausea, vomiting, diarrhea. The pain lasts roughly 2 hours.   Review of Systems     Objective:   Physical Exam Alert and in no distress. Cardiac and lung exam normal. Abdominal exam shows tenderness in the midepigastric and left upper quadrant area.       Assessment & Plan:  Stomach spasm - Plan: hyoscyamine (LEVBID) 0.375 MG 12 hr tablet  I will treat him for about a week on this and hopefully this will make her cycle. He continues to have difficulty, further evaluation will be needed.

## 2014-04-29 NOTE — Patient Instructions (Signed)
Take the medicine for the rest of the week and if you have further problems then make another appointment next week

## 2014-10-22 ENCOUNTER — Other Ambulatory Visit: Payer: Medicare Other

## 2015-08-18 ENCOUNTER — Telehealth: Payer: Self-pay | Admitting: Family Medicine

## 2015-08-18 NOTE — Telephone Encounter (Signed)
Left message for pt to call. Needs a CPE.

## 2015-08-25 ENCOUNTER — Encounter: Payer: Self-pay | Admitting: Medical

## 2015-08-25 ENCOUNTER — Ambulatory Visit (INDEPENDENT_AMBULATORY_CARE_PROVIDER_SITE_OTHER): Payer: Medicare Other | Admitting: Medical

## 2015-08-25 VITALS — BP 100/60 | HR 76 | Wt 198.0 lb

## 2015-08-25 DIAGNOSIS — R1012 Left upper quadrant pain: Secondary | ICD-10-CM

## 2015-08-25 DIAGNOSIS — R1013 Epigastric pain: Secondary | ICD-10-CM

## 2015-08-25 MED ORDER — DEXLANSOPRAZOLE 60 MG PO CPDR: 60.0000 mg | DELAYED_RELEASE_CAPSULE | Freq: Every day | ORAL | Status: DC

## 2015-08-25 NOTE — Progress Notes (Signed)
Subjective: Chief Complaint  Patient presents with  . stomach pain    pain underleft side of his rib cage. started last week, eased off and came back saturday night. feeling constipated but had bowel movement sunday. happened before and took gas pill and it resolved it.    Joseph West is a pleasant 74yo AA male that normally sees Joseph West here for primary care.  Here for pains under left rib cage since 5 days ago, but worse over the weekend.  Lying on left side hurts.  Eating or drinking doesn't seem to make it worse.  Took some Mylanta without improvement.  He and his wife ate fried chicken at [employer] last wednesday. Wife noted that same day having bad indigestion.  Pain is constant.  Eases up at times, but fairly constant.  Pain up to 5+/10.  Has hx/o GERD in general.   Takes zantac 2 in the am, 2 at night, ongoing.  No NVD, no hemoptysis.  Has had endoscopy/EGD 7 years ago, Dr. Verl Blalock.  No hx/o alcohol use, got drunk once in his life.  Is a smoker thought.  He does exercises, pull ups, swims several days a week, 20 laps at a time.  No specific recent injury.    interestingly he came in about a year ago for very similar c/o to see Dr. Tomi West.  No other aggravating or relieving factors. No other complaint.   Past Medical History  Diagnosis Date  . Hiatal hernia 01-10-2007    EGD  . GERD (gastroesophageal reflux disease) 01-10-2007    EGD  . Stricture and stenosis of esophagus 01-10-2007    EGD   . Internal hemorrhoids without mention of complication [id number]    Colonoscopy  . Schatzki's ring   . Allergic rhinitis   . Arthritis    ROS as in subjective  Objective: BP 100/60 mmHg  Pulse 76  Wt 198 lb (89.812 kg)  Gen: wd, wn, nad, younger appearing than stated age Lungs clear Heart RRR, normal S1, S2, no murmurs Abdomen+bs, soft, mild epigastric and LUQ tenderness, otherwise nontender, no mass, no organomegaly Back: nontender No edema Pulses  normal   Assessment: Encounter Diagnoses  Name Primary?  . LUQ abdominal pain Yes  . Abdominal pain, epigastric      Plan: reviewed 04/2014 visits notes here with Dr. Tomi West for basically the same issue.   There is low suspicion of pancreatitis given prior normal lipids, and no alcohol consumption.  I suspect gastritis vs possible ulcer.   C/t zantac 2 BID, begin Dexilant. Samples and script given.  He continues to drink orange juice daily along with other citrus foods.   discussed GERD triggers to avoid including citrus.   He sees Joseph West this Thursday for physical and labs.  Call report within 2 wk.

## 2015-08-28 ENCOUNTER — Encounter: Payer: Self-pay | Admitting: Family Medicine

## 2015-08-28 ENCOUNTER — Ambulatory Visit (INDEPENDENT_AMBULATORY_CARE_PROVIDER_SITE_OTHER): Payer: Medicare Other | Admitting: Family Medicine

## 2015-08-28 VITALS — BP 152/102 | HR 70 | Resp 12 | Ht 71.0 in | Wt 196.6 lb

## 2015-08-28 DIAGNOSIS — R6881 Early satiety: Secondary | ICD-10-CM | POA: Diagnosis not present

## 2015-08-28 DIAGNOSIS — K219 Gastro-esophageal reflux disease without esophagitis: Secondary | ICD-10-CM

## 2015-08-28 DIAGNOSIS — Z23 Encounter for immunization: Secondary | ICD-10-CM | POA: Diagnosis not present

## 2015-08-28 DIAGNOSIS — J302 Other seasonal allergic rhinitis: Secondary | ICD-10-CM | POA: Diagnosis not present

## 2015-08-28 DIAGNOSIS — M1990 Unspecified osteoarthritis, unspecified site: Secondary | ICD-10-CM

## 2015-08-28 LAB — CBC WITH DIFFERENTIAL/PLATELET
BASOS PCT: 0 % (ref 0–1)
Basophils Absolute: 0 10*3/uL (ref 0.0–0.1)
EOS ABS: 0.3 10*3/uL (ref 0.0–0.7)
Eosinophils Relative: 4 % (ref 0–5)
HCT: 44.1 % (ref 39.0–52.0)
Hemoglobin: 15.5 g/dL (ref 13.0–17.0)
Lymphocytes Relative: 42 % (ref 12–46)
Lymphs Abs: 3 10*3/uL (ref 0.7–4.0)
MCH: 29.6 pg (ref 26.0–34.0)
MCHC: 35.1 g/dL (ref 30.0–36.0)
MCV: 84.3 fL (ref 78.0–100.0)
MONO ABS: 0.5 10*3/uL (ref 0.1–1.0)
MONOS PCT: 7 % (ref 3–12)
MPV: 9.6 fL (ref 8.6–12.4)
Neutro Abs: 3.3 10*3/uL (ref 1.7–7.7)
Neutrophils Relative %: 47 % (ref 43–77)
PLATELETS: 270 10*3/uL (ref 150–400)
RBC: 5.23 MIL/uL (ref 4.22–5.81)
RDW: 15.2 % (ref 11.5–15.5)
WBC: 7.1 10*3/uL (ref 4.0–10.5)

## 2015-08-28 LAB — COMPREHENSIVE METABOLIC PANEL
ALT: 14 U/L (ref 9–46)
AST: 26 U/L (ref 10–35)
Albumin: 4.3 g/dL (ref 3.6–5.1)
Alkaline Phosphatase: 96 U/L (ref 40–115)
BUN: 14 mg/dL (ref 7–25)
CHLORIDE: 103 mmol/L (ref 98–110)
CO2: 25 mmol/L (ref 20–31)
CREATININE: 1.2 mg/dL — AB (ref 0.70–1.18)
Calcium: 9.6 mg/dL (ref 8.6–10.3)
GLUCOSE: 86 mg/dL (ref 65–99)
Potassium: 4.5 mmol/L (ref 3.5–5.3)
SODIUM: 139 mmol/L (ref 135–146)
Total Bilirubin: 0.6 mg/dL (ref 0.2–1.2)
Total Protein: 8.2 g/dL — ABNORMAL HIGH (ref 6.1–8.1)

## 2015-08-28 NOTE — Progress Notes (Signed)
   Subjective:    Patient ID: Joseph West, male    DOB: October 28, 1941, 74 y.o.   MRN: [id number]  HPI He is here for an annual wellness visit. His medical and family history were reviewed. Medications were also reviewed . He has no evidence of cognitive impairment. He and his wife are getting along well and life in general is going very well for him. He exercises regularly. He has had no falls. His immunizations and health maintenance are up to date. He has no major risk factors. He has had some recent difficulty with left upper quadrant discomfort. He has not responded to Zantac or recently DEXA [place]. He states that food does help it however after several hours he again has discomfort in the left upper quadrant. He also complains of early satiety. No nausea, vomiting, diarrhea. His allergies are under good control. He does complain of some arthritis but keeps himself quite active with swimming and regular physical activities.   Review of Systems     Objective:   Physical Exam Alert and in no distress. Tympanic membranes and canals are normal. Pharyngeal area is normal. Neck is supple without adenopathy or thyromegaly. Cardiac exam shows a regular sinus rhythm without murmurs or gallops. Lungs are clear to auscultation. Abdominal exam shows no masses or tenderness.       Assessment & Plan:  Early satiety - Plan: CBC with Differential/Platelet, Comprehensive metabolic panel, Ambulatory referral to Gastroenterology  Need for prophylactic vaccination against Streptococcus pneumoniae (pneumococcus) - Plan: Pneumococcal conjugate vaccine 13-valent  Need for prophylactic vaccination and inoculation against influenza - Plan: Flu vaccine HIGH DOSE PF (Fluzone High dose)  Allergic rhinitis, seasonal - Plan: CBC with Differential/Platelet, Comprehensive metabolic panel  Gastroesophageal reflux disease without esophagitis - Plan: CBC with Differential/Platelet, Comprehensive metabolic panel,  Ambulatory referral to Gastroenterology  Arthritis - Plan: CBC with Differential/Platelet, Comprehensive metabolic panel  he was encouraged to keep aching good care of himself and his physical activities. He keeps himself busy intellectually as well. No major interventions are needed in his life as he is taking very good care of himself.

## 2015-09-02 ENCOUNTER — Encounter: Payer: Self-pay | Admitting: Gastroenterology

## 2015-10-29 ENCOUNTER — Ambulatory Visit (INDEPENDENT_AMBULATORY_CARE_PROVIDER_SITE_OTHER): Payer: Medicare Other | Admitting: Gastroenterology

## 2015-10-29 ENCOUNTER — Encounter: Payer: Self-pay | Admitting: Gastroenterology

## 2015-10-29 VITALS — BP 140/90 | HR 80 | Ht 71.0 in | Wt 207.4 lb

## 2015-10-29 DIAGNOSIS — R1012 Left upper quadrant pain: Secondary | ICD-10-CM

## 2015-10-29 NOTE — Patient Instructions (Addendum)
One of your biggest health concerns is your smoking.  This increases your risk for most cancers and serious cardiovascular diseases such as strokes, heart attacks.  You should try your best to stop.  If you need assistance, please contact your PCP or Smoking Cessation Class at [facility] [phone]) or [place] [facility] (1-800-QUIT-NOW). You have been given a separate informational sheet regarding your tobacco use, the importance of quitting and local resources to help you quit. Try cutting back your zantac to once daily. Call if you have problems. Colonoscopy 12/2016 for routine screening.

## 2015-10-29 NOTE — Progress Notes (Signed)
HPI: This is a   very pleasant 74 year old man    who was referred to me by Denita Lung, MD  to evaluate  left upper quadrant pain .    Chief complaint is left upper quadrant pain  He is here with his wife today  He was having left upper quadrant pains.  Grabbing pain, constant pains.  Was given meds from Dr. Glade Lloyd, dexilant, for a month and the pain completely resolved.  For the past month he's been completely fine. Takes zantac (bid) before BF and before dinner.  If he skips 2-3 days he has dysphagia.  Overall stable weight.  Swims daily, 30 minutes, then walks. Then does some pull-ups   He had a colonoscopy with Dr. Verl Blalock February 2008 for routine risk screening. This was normal. He had an upper endoscopy with Dr. Verl Blalock February 2008 for GERD symptoms.  He had a 3 cm hiatal hernia and a "partial stricture" at the GE junction which was dilated with a [place].  October 2016: CBC and complete metabolic profile were both normal except for creatinine of 1.2.    Review of systems: Pertinent positive and negative review of systems were noted in the above HPI section. Complete review of systems was performed and was otherwise normal.   Past Medical History  Diagnosis Date  . Hiatal hernia 01-10-2007    EGD  . GERD (gastroesophageal reflux disease) 01-10-2007    EGD  . Stricture and stenosis of esophagus 01-10-2007    EGD   . Internal hemorrhoids without mention of complication [id number]    Colonoscopy  . Schatzki's ring   . Allergic rhinitis   . Arthritis     Past Surgical History  Procedure Laterality Date  . Colonoscopy  2008    Dr. Sharlett Iles    Current Outpatient Prescriptions  Medication Sig Dispense Refill  . Multiple Vitamin (MULTIVITAMIN) capsule Take 1 capsule by mouth daily.    . ranitidine (ZANTAC) 75 MG tablet Take 150 mg by mouth 2 (two) times daily.      No current facility-administered medications for this visit.     Allergies as of 10/29/2015  . (No Known Allergies)    Family History  Problem Relation Age of Onset  . Coronary artery disease Mother     died in late 55s  . Other Father     died age 60s, unknown cause  . Diabetes Sister   . Other Brother     unknown cause of death  . Alcohol abuse Sister   . Heart disease Sister     stents  . Heart disease Sister     stents    Social History   Social History  . Marital Status: Married    Spouse Name: N/A  . Number of Children: 3  . Years of Education: N/A   Occupational History  . retired Curator [employer]   Social History Main Topics  . Smoking status: Current Every Day Smoker -- 0.25 packs/day    Types: Cigarettes  . Smokeless tobacco: Never Used  . Alcohol Use: No  . Drug Use: No  . Sexual Activity:    Partners: Male   Other Topics Concern  . Not on file   Social History Narrative     Physical Exam: BP 140/90 mmHg  Pulse 80  Ht 5\' 11"  (1.803 m)  Wt 207 lb 6.4 oz (94.076 kg)  BMI 28.94 kg/m2 Constitutional: generally well-appearing Psychiatric:  alert and oriented x3 Eyes: extraocular movements intact Mouth: oral pharynx moist, no lesions Neck: supple no lymphadenopathy Cardiovascular: heart regular rate and rhythm Lungs: clear to auscultation bilaterally Abdomen: soft, nontender, nondistended, no obvious ascites, no peritoneal signs, normal bowel sounds Extremities: no lower extremity edema bilaterally Skin: no lesions on visible extremities   Assessment and plan: 74 y.o. male with  resolved left upper quadrant pain  I suspect his pains were indeed acid related since they completely resolved after proton pump inhibitor therapy for one month. He is back on his usual GERD medicines which is H2 blocker twice daily. He has never tried backing down to once daily which she will do. He has 0 alarm symptoms and I     so I don't think he needs any invasive testing such as upper endoscopy. He will call if  he has any recurrence of the pains or any other questions or concerns. He is due for colon cancer screening again in early [id number] and I certainly think colon cancer screening will be a relevant question for him at that time since he looks very fit and healthy at the age of 49 now.  Owens Loffler, MD [facility] 10/29/2015, 10:36 AM  Cc: Denita Lung, MD

## 2016-10-08 ENCOUNTER — Encounter: Payer: Self-pay | Admitting: Family Medicine

## 2016-10-08 ENCOUNTER — Ambulatory Visit (INDEPENDENT_AMBULATORY_CARE_PROVIDER_SITE_OTHER): Payer: Medicare Other | Admitting: Family Medicine

## 2016-10-08 VITALS — BP 130/90 | HR 72 | Ht 71.0 in | Wt 200.0 lb

## 2016-10-08 DIAGNOSIS — M25551 Pain in right hip: Secondary | ICD-10-CM

## 2016-10-08 DIAGNOSIS — K219 Gastro-esophageal reflux disease without esophagitis: Secondary | ICD-10-CM

## 2016-10-08 DIAGNOSIS — M25552 Pain in left hip: Secondary | ICD-10-CM

## 2016-10-08 DIAGNOSIS — Z0000 Encounter for general adult medical examination without abnormal findings: Secondary | ICD-10-CM

## 2016-10-08 DIAGNOSIS — Z23 Encounter for immunization: Secondary | ICD-10-CM | POA: Diagnosis not present

## 2016-10-08 DIAGNOSIS — Z720 Tobacco use: Secondary | ICD-10-CM

## 2016-10-08 DIAGNOSIS — M1990 Unspecified osteoarthritis, unspecified site: Secondary | ICD-10-CM | POA: Diagnosis not present

## 2016-10-08 DIAGNOSIS — J301 Allergic rhinitis due to pollen: Secondary | ICD-10-CM | POA: Diagnosis not present

## 2016-10-08 LAB — COMPREHENSIVE METABOLIC PANEL
ALK PHOS: 99 U/L (ref 40–115)
ALT: 9 U/L (ref 9–46)
AST: 20 U/L (ref 10–35)
Albumin: 4.1 g/dL (ref 3.6–5.1)
BUN: 13 mg/dL (ref 7–25)
CALCIUM: 9.7 mg/dL (ref 8.6–10.3)
CHLORIDE: 108 mmol/L (ref 98–110)
CO2: 26 mmol/L (ref 20–31)
Creat: 1.34 mg/dL — ABNORMAL HIGH (ref 0.70–1.18)
Glucose, Bld: 103 mg/dL — ABNORMAL HIGH (ref 65–99)
POTASSIUM: 4.7 mmol/L (ref 3.5–5.3)
Sodium: 141 mmol/L (ref 135–146)
TOTAL PROTEIN: 7.6 g/dL (ref 6.1–8.1)
Total Bilirubin: 0.4 mg/dL (ref 0.2–1.2)

## 2016-10-08 LAB — CBC WITH DIFFERENTIAL/PLATELET
BASOS ABS: 0 {cells}/uL (ref 0–200)
BASOS PCT: 0 %
EOS ABS: 336 {cells}/uL (ref 15–500)
Eosinophils Relative: 4 %
HEMATOCRIT: 45.6 % (ref 38.5–50.0)
Hemoglobin: 15.2 g/dL (ref 13.2–17.1)
LYMPHS PCT: 48 %
Lymphs Abs: 4032 cells/uL — ABNORMAL HIGH (ref 850–3900)
MCH: 29.2 pg (ref 27.0–33.0)
MCHC: 33.3 g/dL (ref 32.0–36.0)
MCV: 87.5 fL (ref 80.0–100.0)
MONO ABS: 588 {cells}/uL (ref 200–950)
MPV: 9.3 fL (ref 7.5–12.5)
Monocytes Relative: 7 %
NEUTROS ABS: 3444 {cells}/uL (ref 1500–7800)
Neutrophils Relative %: 41 %
Platelets: 258 10*3/uL (ref 140–400)
RBC: 5.21 MIL/uL (ref 4.20–5.80)
RDW: 15.3 % — ABNORMAL HIGH (ref 11.0–15.0)
WBC: 8.4 10*3/uL (ref 4.0–10.5)

## 2016-10-08 MED ORDER — DICLOFENAC SODIUM 3 % TD GEL: TRANSDERMAL | 5 refills | Status: DC

## 2016-10-08 NOTE — Patient Instructions (Signed)
  Joseph West , Thank you for taking time to come for your Medicare Wellness Visit. I appreciate your ongoing commitment to your health goals. Please review the following plan we discussed and let me know if I can assist you in the future.   These are the goals we discussed: Work on smoking cessation completely and continue with physical activity.   This is a list of the screening recommended for you and due dates:  Health Maintenance  Topic Date Due  . Flu Shot  06/22/2016  . Tetanus Vaccine  01/31/2023  . Shingles Vaccine  Completed  . Pneumonia vaccines  Completed

## 2016-10-08 NOTE — Progress Notes (Signed)
Subjective:   HPI  Joseph West is a 75 y.o. male who presents for a complete physical.  Medical care team includes:  ---   Preventative care: Last ophthalmology visit:year ago Last dental visit: 10/21/16 Last colonoscopy:11/22/06 Last prostate exam: N/A Last EKG:05/16/13 Last labs:08/28/15  Prior vaccinations: TD or Tdap: 05/10/06 Influenza:10/08/16 Pneumococcal:23:03/25/06 13: 09/07/15 Shingles/Zostavax:05/10/06  Advanced directive:No. Information given. Concerns: He does complain of bilateral posterior lateral hip discomfort but he is able to stay quite physically active exercising on a regular basis. He finds that the topical diclofenac works to help with this. He does have underlying reflux disease but very little difficulty with this. He does have seasonal allergies and uses OTC meds for this. Occasionally has difficulty with arthritis type pain. He continues to smoke but usually only 2 or 3 cigarettes per day. He keeps himself quite physically active swimming and exercising regularly.  Reviewed their medical, surgical, family, social, medication, and allergy history and updated chart as appropriate.  Review of Systems Constitutional: -fever, -chills, -sweats, -unexpected weight change, -decreased appetite, -fatigue Dermatology: -changing moles, --rash, -lumps ROS otherwise negative    Objective:   Physical Exam   General appearance: alert, no distress, WD/WN,  Skin:Normal HEENT: normocephalic, conjunctiva/corneas normal, sclerae anicteric, PERRLA, EOMi, nares patent, no discharge or erythema, pharynx normal Oral cavity: MMM, tongue normal, teeth normal Neck: supple, no lymphadenopathy, no thyromegaly, no masses, normal ROM Chest: non tender, normal shape and expansion Heart: RRR, normal S1, S2, no murmurs Lungs: CTA bilaterally, no wheezes, rhonchi, or rales Abdomen: +bs, soft, non tender, non distended, no masses, no hepatomegaly, no splenomegaly, no bruits Back: non  tender, normal ROM, no scoliosis Musculoskeletal: upper extremities non tender, no obvious deformity, normal ROM throughout, lower extremities non tender, no obvious deformity, normal ROM throughout Extremities: no edema, no cyanosis, no clubbing Pulses: 2+ symmetric, upper and lower extremities, normal cap refill Neurological: alert, oriented x 3, CN2-12 intact, strength normal upper extremities and lower extremities, sensation normal throughout, DTRs 2+ throughout, no cerebellar signs, gait normal Psychiatric: normal affect, behavior normal, pleasant    Assessment and Plan :   Need for prophylactic vaccination and inoculation against influenza - Plan: Flu vaccine HIGH DOSE PF (Fluzone High dose)  Occasional cigarette smoker - Plan: CBC with Differential/Platelet, Comprehensive metabolic panel  Gastroesophageal reflux disease without esophagitis - Plan: CBC with Differential/Platelet, Comprehensive metabolic panel  Arthritis - Plan: CBC with Differential/Platelet, Comprehensive metabolic panel  Seasonal allergic rhinitis due to pollen, unspecified chronicity - Plan: CBC with Differential/Platelet, Comprehensive metabolic panel  Pain of both hip joints - Plan: Diclofenac Sodium 3 % GEL Since the diclofenac is working, I will continue him on that. Otherwise encouraged him to continue to take good care of himself as well as work on smoking cessation. He is really only smoking 3 cigarettes per day.  Physical exam - discussed healthy lifestyle, diet, exercise, preventative care, vaccinations, and addressed their concerns.    Follow-up early

## 2016-10-11 ENCOUNTER — Telehealth: Payer: Self-pay | Admitting: Family Medicine

## 2016-10-11 ENCOUNTER — Other Ambulatory Visit: Payer: Self-pay

## 2016-10-11 DIAGNOSIS — R899 Unspecified abnormal finding in specimens from other organs, systems and tissues: Secondary | ICD-10-CM

## 2016-10-11 NOTE — Telephone Encounter (Signed)
P.A. DICLOFENAC GEL  °

## 2016-10-23 NOTE — Telephone Encounter (Signed)
Let him know

## 2016-10-23 NOTE — Telephone Encounter (Signed)
P.A. Diclofenac gel denied, states drugs not on covered formulary but no alternatives listed, do you want to switch to something else or do you want me to try an appeal?

## 2016-10-25 NOTE — Telephone Encounter (Signed)
Laura I think this goes to you 

## 2016-10-27 NOTE — Telephone Encounter (Signed)
Called pt & he is going to try and get it thru the [place] & if he can't he will call me back & see if Dr. Redmond School can switch him to something else

## 2016-12-03 ENCOUNTER — Encounter: Payer: Self-pay | Admitting: Gastroenterology

## 2016-12-06 ENCOUNTER — Encounter: Payer: Self-pay | Admitting: Gastroenterology

## 2017-10-05 ENCOUNTER — Other Ambulatory Visit (INDEPENDENT_AMBULATORY_CARE_PROVIDER_SITE_OTHER): Payer: Medicare Other

## 2017-10-05 DIAGNOSIS — Z23 Encounter for immunization: Secondary | ICD-10-CM

## 2017-12-20 ENCOUNTER — Encounter: Payer: Self-pay | Admitting: Family Medicine

## 2017-12-20 ENCOUNTER — Ambulatory Visit: Payer: Medicare Other | Admitting: Family Medicine

## 2017-12-20 VITALS — BP 128/74 | HR 78 | Ht 70.5 in | Wt 198.2 lb

## 2017-12-20 DIAGNOSIS — J301 Allergic rhinitis due to pollen: Secondary | ICD-10-CM

## 2017-12-20 DIAGNOSIS — M1990 Unspecified osteoarthritis, unspecified site: Secondary | ICD-10-CM

## 2017-12-20 DIAGNOSIS — Z0000 Encounter for general adult medical examination without abnormal findings: Secondary | ICD-10-CM | POA: Diagnosis not present

## 2017-12-20 DIAGNOSIS — K219 Gastro-esophageal reflux disease without esophagitis: Secondary | ICD-10-CM

## 2017-12-20 LAB — POCT URINALYSIS DIP (PROADVANTAGE DEVICE)
Bilirubin, UA: NEGATIVE
Blood, UA: NEGATIVE
Glucose, UA: NEGATIVE mg/dL
Ketones, POC UA: NEGATIVE mg/dL
LEUKOCYTES UA: NEGATIVE
NITRITE UA: NEGATIVE
PH UA: 6 (ref 5.0–8.0)
PROTEIN UA: NEGATIVE mg/dL
Specific Gravity, Urine: 1.025
Urobilinogen, Ur: 3.5

## 2017-12-20 NOTE — Addendum Note (Signed)
Addended by: Elyse Jarvis on: 12/20/2017 02:24 PM   Modules accepted: Orders

## 2017-12-20 NOTE — Progress Notes (Signed)
Joseph West is a 77 y.o. male who presents for annual wellness visit and follow-up on chronic medical conditions.  He has no particular concerns or complaints.  He does have seasonal allergies as well as occasional difficulty with reflux.  He does take Zantac twice per day mainly out of habit rather than out of any true need.  He also complains of slight arthritic symptoms but is very physically active.  He exercises daily.  Life is in general going quite well for him.   Immunizations and Health Maintenance Immunization History  Administered Date(s) Administered  . Influenza, High Dose Seasonal PF 09/04/2013, 08/28/2015, 10/08/2016, 10/05/2017  . Pneumococcal Conjugate-13 08/28/2015  . Pneumococcal Polysaccharide-23 03/25/2006  . Td 11/22/2000  . Tdap 01/30/2013  . Zoster 05/10/2006   There are no preventive care reminders to display for this patient.  Last colonoscopy: N/A Last PSA: last AWV Dentist: 3 months ago Ophtho: 1 year Exercise: everyday  Other doctors caring for patient include:none  Advanced Directives:Yes asked for a copy    Depression screen:  See questionnaire below.     Depression screen Joseph West 2/9 12/20/2017 10/08/2016 08/28/2015 08/28/2015 01/30/2013  Decreased Interest 0 0 0 0 0  Down, Depressed, Hopeless 0 0 0 0 0  PHQ - 2 Score 0 0 0 0 0    Fall Screen: See Questionaire below.   Fall Risk  12/20/2017 10/08/2016 08/28/2015 08/28/2015 01/30/2013  Falls in the past year? No No No No No    ADL screen:  See questionnaire below.  Functional Status Survey:     Review of Systems  Constitutional: -, -unexpected weight change, -anorexia, -fatigue Allergy: -sneezing, -itching, -congestion Dermatology: denies changing moles, rash, lumps ENT: -runny nose, -ear pain, -sore throat,  Cardiology:  -chest pain, -palpitations, -orthopnea, Respiratory: -cough, -shortness of breath, -dyspnea on exertion, -wheezing,  Gastroenterology: -abdominal pain, -nausea, -vomiting,  -diarrhea, -constipation, -dysphagia Hematology: -bleeding or bruising problems Musculoskeletal: -arthralgias, -myalgias, -joint swelling, -back pain, - Ophthalmology: -vision changes,  Urology: -dysuria, -difficulty urinating,  -urinary frequency, -urgency, incontinence Neurology: -, -numbness, , -memory loss, -falls, -dizziness    PHYSICAL EXAM:    General Appearance: Alert, cooperative, no distress, appears stated age Head: Normocephalic, without obvious abnormality, atraumatic Eyes: PERRL, conjunctiva/corneas clear, EOM's intact, fundi benign Ears: Normal TM's and external ear canals Nose: Nares normal, mucosa normal, no drainage or sinus   tenderness Throat: Lips, mucosa, and tongue normal; teeth and gums normal Neck: Supple, no lymphadenopathy, thyroid:no enlargement/tenderness/nodules; no carotid bruit or JVD Lungs: Clear to auscultation bilaterally without wheezes, rales or ronchi; respirations unlabored Heart: Regular rate and rhythm, S1 and S2 normal, no murmur, rub or gallop Abdomen: Soft, non-tender, nondistended, normoactive bowel sounds, no masses, no hepatosplenomegaly Extremities: No clubbing, cyanosis or edema Pulses: 2+ and symmetric all extremities Skin: Skin color, texture, turgor normal, no rashes or lesions Lymph nodes: Cervical, supraclavicular, and axillary nodes normal Neurologic: CNII-XII intact, normal strength, sensation and gait; reflexes 2+ and symmetric throughout   Psych: Normal mood, affect, hygiene and grooming  ASSESSMENT/PLAN: Gastroesophageal reflux disease without esophagitis - Plan: Comprehensive metabolic panel, CBC with Differential/Platelet  Seasonal allergic rhinitis due to pollen - Plan: Comprehensive metabolic panel, CBC with Differential/Platelet  Arthritis - Plan: Comprehensive metabolic panel, CBC with Differential/Platelet Overall he is taking very good care of himself and I think recommend he continue to do that. Medicare  Attestation I have personally reviewed: The patient's medical and social history Their use of alcohol, tobacco or illicit drugs Their current medications  and supplements The patient's functional ability including ADLs,fall risks, home safety risks, cognitive, and hearing and visual impairment Diet and physical activities Evidence for depression or mood disorders  The patient's weight, height, and BMI have been recorded in the chart.  I have made referrals, counseling, and provided education to the patient based on review of the above and I have provided the patient with a written personalized care plan for preventive services.     Jill Alexanders, MD   12/20/2017

## 2017-12-20 NOTE — Patient Instructions (Addendum)
Cut Zantac back to just 1 a day and see if your symptoms stay under control and if that is the case then go to every other day and even possibly every third day   Joseph West , Thank you for taking time to come for your Medicare Wellness Visit. I appreciate your ongoing commitment to your health goals. Please review the following plan we discussed and let me know if I can assist you in the future.   These are the goals we discussed: Goals    None      This is a list of the screening recommended for you and due dates:  Health Maintenance  Topic Date Due  . Tetanus Vaccine  01/31/2023  . Flu Shot  Completed  . Pneumonia vaccines  Completed

## 2017-12-21 LAB — COMPREHENSIVE METABOLIC PANEL
A/G RATIO: 1.2 (ref 1.2–2.2)
ALT: 10 IU/L (ref 0–44)
AST: 21 IU/L (ref 0–40)
Albumin: 4.2 g/dL (ref 3.5–4.8)
Alkaline Phosphatase: 102 IU/L (ref 39–117)
BILIRUBIN TOTAL: 0.3 mg/dL (ref 0.0–1.2)
BUN/Creatinine Ratio: 13 (ref 10–24)
BUN: 17 mg/dL (ref 8–27)
CALCIUM: 9.7 mg/dL (ref 8.6–10.2)
CHLORIDE: 105 mmol/L (ref 96–106)
CO2: 20 mmol/L (ref 20–29)
Creatinine, Ser: 1.33 mg/dL — ABNORMAL HIGH (ref 0.76–1.27)
GFR calc Af Amer: 59 mL/min/{1.73_m2} — ABNORMAL LOW (ref >59)
GFR calc non Af Amer: 51 mL/min/{1.73_m2} — ABNORMAL LOW (ref >59)
Globulin, Total: 3.6 g/dL (ref 1.5–4.5)
Glucose: 96 mg/dL (ref 65–99)
POTASSIUM: 4.9 mmol/L (ref 3.5–5.2)
Sodium: 142 mmol/L (ref 134–144)
Total Protein: 7.8 g/dL (ref 6.0–8.5)

## 2017-12-21 LAB — CBC WITH DIFFERENTIAL/PLATELET
BASOS ABS: 0 10*3/uL (ref 0.0–0.2)
Basos: 0 %
EOS (ABSOLUTE): 0.4 10*3/uL (ref 0.0–0.4)
Eos: 5 %
Hematocrit: 41.1 % (ref 37.5–51.0)
Hemoglobin: 14.4 g/dL (ref 13.0–17.7)
IMMATURE GRANS (ABS): 0 10*3/uL (ref 0.0–0.1)
IMMATURE GRANULOCYTES: 0 %
LYMPHS: 49 %
Lymphocytes Absolute: 3.9 10*3/uL — ABNORMAL HIGH (ref 0.7–3.1)
MCH: 29 pg (ref 26.6–33.0)
MCHC: 35 g/dL (ref 31.5–35.7)
MCV: 83 fL (ref 79–97)
Monocytes Absolute: 0.6 10*3/uL (ref 0.1–0.9)
Monocytes: 8 %
NEUTROS PCT: 38 %
Neutrophils Absolute: 3 10*3/uL (ref 1.4–7.0)
PLATELETS: 266 10*3/uL (ref 150–379)
RBC: 4.96 x10E6/uL (ref 4.14–5.80)
RDW: 15.4 % (ref 12.3–15.4)
WBC: 8 10*3/uL (ref 3.4–10.8)

## 2018-08-30 ENCOUNTER — Telehealth: Payer: Self-pay | Admitting: Family Medicine

## 2018-08-30 MED ORDER — NIZATIDINE 150 MG PO CAPS: 150.0000 mg | ORAL_CAPSULE | Freq: Two times a day (BID) | ORAL | 3 refills | Status: DC

## 2018-08-30 NOTE — Telephone Encounter (Signed)
Pt come by and has been taking Zantac 150 mg that the [place] has prescribed him and is wanting to switch to something else  States he stopped taking the 75mg  that you prescibed him. pt  [place], [place] pt can be reached at [phone]

## 2018-08-30 NOTE — Telephone Encounter (Signed)
done

## 2018-08-31 NOTE — Telephone Encounter (Signed)
Pt advised. KH 

## 2018-09-04 ENCOUNTER — Telehealth: Payer: Self-pay | Admitting: Family Medicine

## 2018-09-04 NOTE — Telephone Encounter (Signed)
Yes this is fine.

## 2018-09-04 NOTE — Telephone Encounter (Signed)
Done KH 

## 2018-09-04 NOTE — Telephone Encounter (Signed)
Pt came in and stated that he recently went to [place] to get Shingles Vaccine. While there they gave him Ranitidine to replace Zantec. He states the one you gave him was too expensive. He wants to know if this is ok? Please advise pt at [phone].

## 2018-11-06 ENCOUNTER — Ambulatory Visit: Payer: Medicare Other | Admitting: Family Medicine

## 2018-11-06 ENCOUNTER — Telehealth: Payer: Self-pay

## 2018-11-06 ENCOUNTER — Encounter: Payer: Self-pay | Admitting: Family Medicine

## 2018-11-06 VITALS — BP 124/76 | HR 82 | Temp 98.0°F | Wt 205.6 lb

## 2018-11-06 DIAGNOSIS — K219 Gastro-esophageal reflux disease without esophagitis: Secondary | ICD-10-CM | POA: Diagnosis not present

## 2018-11-06 DIAGNOSIS — K5900 Constipation, unspecified: Secondary | ICD-10-CM | POA: Diagnosis not present

## 2018-11-06 NOTE — Progress Notes (Signed)
   Subjective:    Patient ID: Joseph West, male    DOB: 05/13/1941, 77 y.o.   MRN: [id number]  HPI He complains of a 6-day history of midepigastric pain.  Food has no effect on this.  He does have a history of reflux and presently is taking ranitidine.  He states that this is a different kind of symptom.  He is also been constipated and does say that when he has a BM, the midepigastric pain does get better.  He has tried milk of mag without much success.   Review of Systems     Objective:   Physical Exam Alert and in no distress.  Slight tenderness palpation in the midepigastric area but negative Murphy sign and Murphy's punch.  No lower abdominal discomfort.  Rectal exam shows no stool in the vault and is guaiac negative.       Assessment & Plan:  Gastroesophageal reflux disease without esophagitis - Plan: POCT occult blood stool  Constipation, unspecified constipation type - Plan: POCT occult blood stool He is to double his ranitidine to see if this will help.  Also recommend fluids and MiraLAX to help with his constipation.If he continues have difficulty he will return here for further evaluation.  He was comfortable with that

## 2018-11-06 NOTE — Telephone Encounter (Signed)
He already has a bottle.  I only want him on that for the next week.

## 2018-11-06 NOTE — Telephone Encounter (Signed)
To you 

## 2018-11-06 NOTE — Patient Instructions (Signed)
Double the ranitidine for the next week.  Get MiraLAX and use that daily again for the next week.  Make sure you drink plenty of fluids

## 2018-11-06 NOTE — Telephone Encounter (Signed)
Please advise if pt is to take two of the 150 mg and if he needs a script. Note says to two two for a week nut no script was sent in . Thanks [employer]

## 2018-11-07 NOTE — Telephone Encounter (Signed)
Done KH 

## 2018-11-10 ENCOUNTER — Telehealth: Payer: Self-pay

## 2018-11-10 NOTE — Telephone Encounter (Signed)
LVM for pt to call and make appt. If he continues to have issues. Sharkey

## 2018-11-10 NOTE — Telephone Encounter (Signed)
If he keeps having trouble, he should make an appointment

## 2018-11-10 NOTE — Telephone Encounter (Signed)
Patient wife called and stated he has been taking AXID and ZANTAC. She stated after he takes it and drink water he starts to feel bad. Patient wife was informed that patient should be taking the Zantac and not the AXID because at last  Visit it was reported that he was not taking AXID and both medications are treating the same thing. For this reason may be cause of him not feeling well after taking both medications daily.    Would you like to add any recommendations?

## 2018-11-14 ENCOUNTER — Other Ambulatory Visit (INDEPENDENT_AMBULATORY_CARE_PROVIDER_SITE_OTHER): Payer: Medicare Other

## 2018-11-14 ENCOUNTER — Ambulatory Visit (INDEPENDENT_AMBULATORY_CARE_PROVIDER_SITE_OTHER): Payer: Medicare Other | Admitting: Gastroenterology

## 2018-11-14 ENCOUNTER — Encounter: Payer: Self-pay | Admitting: Gastroenterology

## 2018-11-14 VITALS — BP 110/68 | HR 80 | Ht 70.0 in | Wt 201.0 lb

## 2018-11-14 DIAGNOSIS — R1013 Epigastric pain: Secondary | ICD-10-CM

## 2018-11-14 DIAGNOSIS — R1011 Right upper quadrant pain: Secondary | ICD-10-CM

## 2018-11-14 LAB — CBC WITH DIFFERENTIAL/PLATELET
Basophils Absolute: 0.1 10*3/uL (ref 0.0–0.1)
Basophils Relative: 0.8 % (ref 0.0–3.0)
EOS ABS: 0.5 10*3/uL (ref 0.0–0.7)
EOS PCT: 5.8 % — AB (ref 0.0–5.0)
HCT: 43.7 % (ref 39.0–52.0)
HEMOGLOBIN: 14.5 g/dL (ref 13.0–17.0)
LYMPHS ABS: 3.6 10*3/uL (ref 0.7–4.0)
Lymphocytes Relative: 45.8 % (ref 12.0–46.0)
MCHC: 33.2 g/dL (ref 30.0–36.0)
MCV: 86.5 fl (ref 78.0–100.0)
MONO ABS: 0.6 10*3/uL (ref 0.1–1.0)
Monocytes Relative: 8 % (ref 3.0–12.0)
NEUTROS PCT: 39.6 % — AB (ref 43.0–77.0)
Neutro Abs: 3.1 10*3/uL (ref 1.4–7.7)
Platelets: 283 10*3/uL (ref 150.0–400.0)
RBC: 5.05 Mil/uL (ref 4.22–5.81)
RDW: 14.5 % (ref 11.5–15.5)
WBC: 7.8 10*3/uL (ref 4.0–10.5)

## 2018-11-14 LAB — COMPREHENSIVE METABOLIC PANEL
ALBUMIN: 4 g/dL (ref 3.5–5.2)
ALK PHOS: 91 U/L (ref 39–117)
ALT: 15 U/L (ref 0–53)
AST: 27 U/L (ref 0–37)
BUN: 18 mg/dL (ref 6–23)
CO2: 24 mEq/L (ref 19–32)
CREATININE: 1.46 mg/dL (ref 0.40–1.50)
Calcium: 9.8 mg/dL (ref 8.4–10.5)
Chloride: 104 mEq/L (ref 96–112)
GFR: 59.92 mL/min — ABNORMAL LOW (ref >60.00)
GLUCOSE: 102 mg/dL — AB (ref 70–99)
Potassium: 4 mEq/L (ref 3.5–5.1)
SODIUM: 137 meq/L (ref 135–145)
TOTAL PROTEIN: 8.2 g/dL (ref 6.0–8.3)
Total Bilirubin: 0.6 mg/dL (ref 0.2–1.2)

## 2018-11-14 MED ORDER — OMEPRAZOLE 40 MG PO CPDR: 40.0000 mg | DELAYED_RELEASE_CAPSULE | Freq: Every day | ORAL | 5 refills | Status: DC

## 2018-11-14 NOTE — Patient Instructions (Addendum)
Stop zantac.  Start omeprazole '40mg'$  pills, one pill before breakfast every morning, disp 30 with 5 refills.  You will be set up for a CT scan of abdomen and pelvis with IV and oral contrast for RUQ, epigastric abdominal pains.  You have been scheduled for a CT scan of the abdomen and pelvis at [facility] ([address]---this is in the same building as Press [facility]).   You are scheduled on        at         . You should arrive 15 minutes prior to your appointment time for registration. Please follow the written instructions below on the day of your exam:  WARNING: IF YOU ARE ALLERGIC TO IODINE/X-RAY DYE, PLEASE NOTIFY RADIOLOGY IMMEDIATELY AT [phone]! YOU WILL BE GIVEN A 13 HOUR PREMEDICATION PREP.  1) Do not eat or drink anything after           (4 hours prior to your test) 2) You have been given 2 bottles of oral contrast to drink. The solution may taste better if refrigerated, but do NOT add ice or any other liquid to this solution. Shake well before drinking.    Drink 1 bottle of contrast @            (2 hours prior to your exam)  Drink 1 bottle of contrast @            (1 hour prior to your exam)  You may take any medications as prescribed with a small amount of water, if necessary. If you take any of the following medications: METFORMIN, GLUCOPHAGE, GLUCOVANCE, AVANDAMET, RIOMET, FORTAMET, [place] MET, JANUMET, GLUMETZA or METAGLIP, you MAY be asked to HOLD this medication 48 hours AFTER the exam.  The purpose of you drinking the oral contrast is to aid in the visualization of your intestinal tract. The contrast solution may cause some diarrhea. Depending on your individual set of symptoms, you may also receive an intravenous injection of x-ray contrast/dye. Plan on being at [facility] for 30 minutes or longer, depending on the type of exam you are having performed.  This test typically takes 30-45 minutes to complete.  If you have any questions  regarding your exam or if you need to reschedule, you may call the CT department at [phone] between the hours of 8:00 am and 5:00 pm, Monday-Friday.  ________________________________________________________________________  Your provider has requested that you go to the basement level for lab work before leaving today. Press "B" on the elevator. The lab is located at the first door on the left as you exit the elevator.  You will have labs checked today in the basement lab.  Please head down after you check out with the front desk  (cbc, cmet).

## 2018-11-14 NOTE — Progress Notes (Signed)
HPI: This is a very pleasant 77 year old man who was referred to me by Joseph Lung, MD  to evaluate right upper quadrant pain, recent change in bowels.    Chief complaint is right upper quadrant pain, recent change in bowels   About 2 weeks ago he noticed some change in his bowels.  Constipation.  He took some mag citrate with decent results.  Her but his bowels are better but not back to normal yet.  He has seen no blood in his stool.  Around the same time he started having some right upper quadrant pains.  These are constant.  They are not associate with nausea.  They do not radiate anywhere.  He calls him a burning pain.  He has had no weight loss.  The pains in his right side are limiting him from doing his daily swimming and golf exercises.  They are not particularly positional.  Sometimes the pains are improved when he moves his bowels or passes gas.  Not always.  Eating does not make them worse.  He also has chronic burning in his chest, pyrosis.     Old Data Reviewed: He had a colonoscopy with Dr. Verl West February 2008 for routine risk screening. This was normal.  He had an upper endoscopy with Dr. Verl West February 2008 for GERD symptoms.  He had a 3 cm hiatal hernia and a "partial stricture" at the GE junction which was dilated with a Joseph West.    Review of systems: Pertinent positive and negative review of systems were noted in the above HPI section. All other review negative.   Past Medical History:  Diagnosis Date  . Allergic rhinitis   . Arthritis   . GERD (gastroesophageal reflux disease) 01-10-2007   EGD  . Hiatal hernia 01-10-2007   EGD  . Internal hemorrhoids without mention of complication [id number]   Colonoscopy  . Schatzki's ring   . Stricture and stenosis of esophagus 01-10-2007   EGD     Past Surgical History:  Procedure Laterality Date  . COLONOSCOPY  2008   Dr. Sharlett West    Current Outpatient Medications  Medication  Sig Dispense Refill  . Multiple Vitamin (MULTIVITAMIN) capsule Take 1 capsule by mouth daily.    . ranitidine (ZANTAC) 150 MG tablet Take 300 mg by mouth daily.     No current facility-administered medications for this visit.     Allergies as of 11/14/2018  . (No Known Allergies)    Family History  Problem Relation Age of Onset  . Coronary artery disease Mother        died in late 8s  . Other Father        died age 78s, unknown cause  . Diabetes Sister   . Other Brother        unknown cause of death  . Alcohol abuse Sister   . Heart disease Sister        stents  . Heart disease Sister        stents    Social History   Socioeconomic History  . Marital status: Married    Spouse name: Not on file  . Number of children: 3  . Years of education: Not on file  . Highest education level: Not on file  Occupational History  . Occupation: retired Banker: [employer]  . Financial resource strain: Not on file  . Food insecurity:  Worry: Not on file    Inability: Not on file  . Transportation needs:    Medical: Not on file    Non-medical: Not on file  Tobacco Use  . Smoking status: Current Every Day Smoker    Packs/day: 0.25    Types: Cigarettes  . Smokeless tobacco: Never Used  Substance and Sexual Activity  . Alcohol use: No    Alcohol/week: 0.0 standard drinks  . Drug use: No  . Sexual activity: Yes    Partners: Male  Lifestyle  . Physical activity:    Days per week: Not on file    Minutes per session: Not on file  . Stress: Not on file  Relationships  . Social connections:    Talks on phone: Not on file    Gets together: Not on file    Attends religious service: Not on file    Active member of club or organization: Not on file    Attends meetings of clubs or organizations: Not on file    Relationship status: Not on file  . Intimate partner violence:    Fear of current or ex partner: Not on file    Emotionally abused:  Not on file    Physically abused: Not on file    Forced sexual activity: Not on file  Other Topics Concern  . Not on file  Social History Narrative  . Not on file     Physical Exam: Ht 5\' 10"  (1.778 m)   Wt 201 lb (91.2 kg)   BMI 28.84 kg/m  Constitutional: generally well-appearing Psychiatric: alert and oriented x3 Eyes: extraocular movements intact Mouth: oral pharynx moist, no lesions Neck: supple no lymphadenopathy Cardiovascular: heart regular rate and rhythm Lungs: clear to auscultation bilaterally Abdomen: soft, mildly tender right upper quadrant, nondistended, no obvious ascites, no peritoneal signs, normal bowel sounds Extremities: no lower extremity edema bilaterally Skin: no lesions on visible extremities   Assessment and plan: 77 y.o. male with change in bowels, right upper quadrant tenderness, chronic GERD  First for his chronic GERD on asked him to stop the H2 blocker and he will start omeprazole once daily.  To work-up his right upper quadrant pains were to start with a CT scan abdomen and pelvis and blood work.  Think there is a pretty decent chance that unless his symptoms significantly resolve on antiacid therapy I will likely recommend an upper endoscopy and probably a colonoscopy at the same time.    Please see the "Patient Instructions" section for addition details about the plan.   Joseph Loffler, MD [facility] 11/14/2018, 9:06 AM  Cc: Joseph Lung, MD

## 2018-11-17 ENCOUNTER — Telehealth: Payer: Self-pay

## 2018-11-17 NOTE — Telephone Encounter (Signed)
Spoke to patients wife. Informed her of the date and time of patients CT of abdomen and pelvis. Instructions have been mailed out

## 2018-11-28 ENCOUNTER — Ambulatory Visit (HOSPITAL_COMMUNITY)
Admission: RE | Admit: 2018-11-28 | Discharge: 2018-11-28 | Disposition: A | Discharge status: Home / Self Care | Payer: Medicare Other | Source: Ambulatory Visit | Attending: Gastroenterology | Admitting: Gastroenterology

## 2018-11-28 ENCOUNTER — Encounter (HOSPITAL_COMMUNITY): Payer: Self-pay

## 2018-11-28 DIAGNOSIS — R1011 Right upper quadrant pain: Secondary | ICD-10-CM

## 2018-11-28 DIAGNOSIS — R1013 Epigastric pain: Secondary | ICD-10-CM | POA: Diagnosis present

## 2018-11-28 MED ORDER — IOHEXOL 300 MG/ML  SOLN
100.0000 mL | Freq: Once | INTRAMUSCULAR | Status: AC | PRN
  Administered 2018-11-28: 100 mL via INTRAVENOUS

## 2018-11-28 MED ORDER — SODIUM CHLORIDE (PF) 0.9 % IJ SOLN
INTRAMUSCULAR | Status: AC
  Filled 2018-11-28: qty 50

## 2018-12-07 ENCOUNTER — Encounter: Payer: Self-pay | Admitting: Family Medicine

## 2018-12-07 DIAGNOSIS — I714 Abdominal aortic aneurysm, without rupture, unspecified: Secondary | ICD-10-CM | POA: Insufficient documentation

## 2018-12-07 NOTE — Progress Notes (Signed)
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

## 2018-12-19 ENCOUNTER — Ambulatory Visit (AMBULATORY_SURGERY_CENTER): Payer: Self-pay

## 2018-12-19 ENCOUNTER — Encounter: Payer: Self-pay | Admitting: Gastroenterology

## 2018-12-19 VITALS — Ht 72.0 in | Wt 206.0 lb

## 2018-12-19 DIAGNOSIS — R194 Change in bowel habit: Secondary | ICD-10-CM

## 2018-12-19 DIAGNOSIS — R1011 Right upper quadrant pain: Secondary | ICD-10-CM

## 2018-12-19 MED ORDER — PEG 3350-KCL-NA BICARB-NACL 420 G PO SOLR: 4000.0000 mL | Freq: Once | ORAL | 0 refills | Status: AC

## 2018-12-19 NOTE — Progress Notes (Signed)
Denies allergies to eggs or soy products. Denies complication of anesthesia or sedation. Denies use of weight loss medication. Denies use of O2.   Emmi instructions declined.  

## 2018-12-26 ENCOUNTER — Ambulatory Visit (AMBULATORY_SURGERY_CENTER): Payer: Medicare Other | Admitting: Gastroenterology

## 2018-12-26 ENCOUNTER — Encounter: Payer: Self-pay | Admitting: Gastroenterology

## 2018-12-26 VITALS — BP 124/81 | HR 79 | Temp 98.6°F | Resp 17 | Ht 70.0 in | Wt 206.0 lb

## 2018-12-26 DIAGNOSIS — K3189 Other diseases of stomach and duodenum: Secondary | ICD-10-CM | POA: Diagnosis not present

## 2018-12-26 DIAGNOSIS — R1011 Right upper quadrant pain: Secondary | ICD-10-CM

## 2018-12-26 DIAGNOSIS — K319 Disease of stomach and duodenum, unspecified: Secondary | ICD-10-CM | POA: Diagnosis not present

## 2018-12-26 DIAGNOSIS — K2970 Gastritis, unspecified, without bleeding: Secondary | ICD-10-CM | POA: Diagnosis not present

## 2018-12-26 DIAGNOSIS — K635 Polyp of colon: Secondary | ICD-10-CM | POA: Diagnosis not present

## 2018-12-26 DIAGNOSIS — D124 Benign neoplasm of descending colon: Secondary | ICD-10-CM

## 2018-12-26 DIAGNOSIS — K449 Diaphragmatic hernia without obstruction or gangrene: Secondary | ICD-10-CM

## 2018-12-26 DIAGNOSIS — R194 Change in bowel habit: Secondary | ICD-10-CM

## 2018-12-26 DIAGNOSIS — D123 Benign neoplasm of transverse colon: Secondary | ICD-10-CM

## 2018-12-26 DIAGNOSIS — K2990 Gastroduodenitis, unspecified, without bleeding: Secondary | ICD-10-CM

## 2018-12-26 MED ORDER — SODIUM CHLORIDE 0.9 % IV SOLN: 500.0000 mL | Freq: Once | INTRAVENOUS | Status: DC

## 2018-12-26 NOTE — Progress Notes (Signed)
Pt's states no medical or surgical changes since previsit or office visit. 

## 2018-12-26 NOTE — Progress Notes (Signed)
Called to room to assist during endoscopic procedure.  Patient ID and intended procedure confirmed with present staff. Received instructions for my participation in the procedure from the performing physician.  

## 2018-12-26 NOTE — Op Note (Signed)
[facility] Patient Name: Joseph West Procedure Date: 12/26/2018 2:45 PM MRN: [id number] Endoscopist: [name] , MD Age: 78 Referring MD:  Date of Birth: 1940/12/18 Gender: Male Account #: [id number] Procedure:                Upper GI endoscopy Indications:              Abdominal pain in the right side Medicines:                Monitored Anesthesia Care Procedure:                Pre-Anesthesia Assessment:                           - Prior to the procedure, a History and Physical                            was performed, and patient medications and                            allergies were reviewed. The patient's tolerance of                            previous anesthesia was also reviewed. The risks                            and benefits of the procedure and the sedation                            options and risks were discussed with the patient.                            All questions were answered, and informed consent                            was obtained. Prior Anticoagulants: The patient has                            taken no previous anticoagulant or antiplatelet                            agents. ASA Grade Assessment: II - A patient with                            mild systemic disease. After reviewing the risks                            and benefits, the patient was deemed in                            satisfactory condition to undergo the procedure.                           After obtaining informed consent, the endoscope was  passed under direct vision. Throughout the                            procedure, the patient's blood pressure, pulse, and                            oxygen saturations were monitored continuously. The                            Endoscope was introduced through the mouth, and                            advanced to the second part of duodenum. The upper                            GI endoscopy was  accomplished without difficulty.                            The patient tolerated the procedure well. Scope In: Scope Out: Findings:                 Mild inflammation characterized by erythema,                            friability and granularity was found in the gastric                            antrum. Biopsies were taken with a cold forceps for                            histology.                           A small hiatal hernia was present.                           The exam was otherwise without abnormality. Complications:            No immediate complications. Estimated blood loss:                            None. Estimated Blood Loss:     Estimated blood loss: none. Impression:               - Gastritis. Biopsied.                           - Small hiatal hernia.                           - The examination was otherwise normal. Recommendation:           - Patient has a contact number available for                            emergencies. The signs and symptoms of potential  delayed complications were discussed with the                            patient. Return to normal activities tomorrow.                            Written discharge instructions were provided to the                            patient.                           - Resume previous diet.                           - Continue present medications.                           - Await pathology results. [name], MD 12/26/2018 3:16:09 PM This report has been signed electronically.

## 2018-12-26 NOTE — Op Note (Signed)
[facility] Patient Name: Joseph West Procedure Date: 12/26/2018 2:45 PM MRN: [id number] Endoscopist: [name] , MD Age: 78 Referring MD:  Date of Birth: [birth year] Gender: Male Account #: [id number] Procedure:                Colonoscopy Indications:              Change in bowel habits Medicines:                Monitored Anesthesia Care Procedure:                Pre-Anesthesia Assessment:                           - Prior to the procedure, a History and Physical                            was performed, and patient medications and                            allergies were reviewed. The patient's tolerance of                            previous anesthesia was also reviewed. The risks                            and benefits of the procedure and the sedation                            options and risks were discussed with the patient.                            All questions were answered, and informed consent                            was obtained. Prior Anticoagulants: The patient has                            taken no previous anticoagulant or antiplatelet                            agents. ASA Grade Assessment: II - A patient with                            mild systemic disease. After reviewing the risks                            and benefits, the patient was deemed in                            satisfactory condition to undergo the procedure.                           After obtaining informed consent, the colonoscope  was passed under direct vision. Throughout the                            procedure, the patient's blood pressure, pulse, and                            oxygen saturations were monitored continuously. The                            Model CF-HQ190L [id number]) scope was introduced                            through the anus and advanced to the the cecum,                            identified by appendiceal orifice and  ileocecal                            valve. The colonoscopy was performed without                            difficulty. The patient tolerated the procedure                            well. The quality of the bowel preparation was                            good. The ileocecal valve, appendiceal orifice, and                            rectum were photographed. Scope In: 2:49:48 PM Scope Out: 3:04:59 PM Scope Withdrawal Time: 0 hours 10 minutes 12 seconds  Total Procedure Duration: 0 hours 15 minutes 11 seconds  Findings:                 Three sessile polyps were found in the descending                            colon and transverse colon. The polyps were 2 to 3                            mm in size. These polyps were removed with a cold                            snare. Resection and retrieval were complete.                           Internal hemorrhoids were found. The hemorrhoids                            were medium-sized.                           The exam was otherwise without abnormality on  direct and retroflexion views. Complications:            No immediate complications. Estimated blood loss:                            None. Estimated Blood Loss:     Estimated blood loss: none. Impression:               - Three 2 to 3 mm polyps in the descending colon                            and in the transverse colon, removed with a cold                            snare. Resected and retrieved.                           - Internal hemorrhoids.                           - The examination was otherwise normal on direct                            and retroflexion views. Recommendation:           - Patient has a contact number available for                            emergencies. The signs and symptoms of potential                            delayed complications were discussed with the                            patient. Return to normal activities tomorrow.                             Written discharge instructions were provided to the                            patient.                           - Resume previous diet.                           - Continue present medications.                           - Await path results for final recommendations. [name], MD 12/26/2018 3:07:50 PM This report has been signed electronically.

## 2018-12-26 NOTE — Patient Instructions (Signed)
Handouts Provided:  Polyps and Hemorrhoids  YOU HAD AN ENDOSCOPIC PROCEDURE TODAY AT [facility]:   Refer to the procedure report that was given to you for any specific questions about what was found during the examination.  If the procedure report does not answer your questions, please call your gastroenterologist to clarify.  If you requested that your care partner not be given the details of your procedure findings, then the procedure report has been included in a sealed envelope for you to review at your convenience later.  YOU SHOULD EXPECT: Some feelings of bloating in the abdomen. Passage of more gas than usual.  Walking can help get rid of the air that was put into your GI tract during the procedure and reduce the bloating. If you had a lower endoscopy (such as a colonoscopy or flexible sigmoidoscopy) you may notice spotting of blood in your stool or on the toilet paper. If you underwent a bowel prep for your procedure, you may not have a normal bowel movement for a few days.  Please Note:  You might notice some irritation and congestion in your nose or some drainage.  This is from the oxygen used during your procedure.  There is no need for concern and it should clear up in a day or so.  SYMPTOMS TO REPORT IMMEDIATELY:   Following lower endoscopy (colonoscopy or flexible sigmoidoscopy):  Excessive amounts of blood in the stool  Significant tenderness or worsening of abdominal pains  Swelling of the abdomen that is new, acute  Fever of 100F or higher   Following upper endoscopy (EGD)  Vomiting of blood or coffee ground material  New chest pain or pain under the shoulder blades  Painful or persistently difficult swallowing  New shortness of breath  Fever of 100F or higher  Black, tarry-looking stools  For urgent or emergent issues, a gastroenterologist can be reached at any hour by calling [phone].   DIET:  We do recommend a small meal at first, but then  you may proceed to your regular diet.  Drink plenty of fluids but you should avoid alcoholic beverages for 24 hours.  ACTIVITY:  You should plan to take it easy for the rest of today and you should NOT DRIVE or use heavy machinery until tomorrow (because of the sedation medicines used during the test).    FOLLOW UP: Our staff will call the number listed on your records the next business day following your procedure to check on you and address any questions or concerns that you may have regarding the information given to you following your procedure. If we do not reach you, we will leave a message.  However, if you are feeling well and you are not experiencing any problems, there is no need to return our call.  We will assume that you have returned to your regular daily activities without incident.  If any biopsies were taken you will be contacted by phone or by letter within the next 1-3 weeks.  Please call us at [phone] if you have not heard about the biopsies in 3 weeks.    SIGNATURES/CONFIDENTIALITY: You and/or your care partner have signed paperwork which will be entered into your electronic medical record.  These signatures attest to the fact that that the information above on your After Visit Summary has been reviewed and is understood.  Full responsibility of the confidentiality of this discharge information lies with you and/or your care-partner.

## 2018-12-26 NOTE — Progress Notes (Signed)
To PACU, VSS. Report to [email or web address] 

## 2018-12-27 ENCOUNTER — Telehealth: Payer: Self-pay

## 2018-12-27 NOTE — Telephone Encounter (Signed)
  Follow up Call-  Call back number 12/26/2018  Post procedure Call Back phone  # [phone]  Permission to leave phone message Yes  Some recent data might be hidden     Patient questions:  Do you have a fever, pain , or abdominal swelling? No. Pain Score  0 *  Have you tolerated food without any problems? Yes.    Have you been able to return to your normal activities? Yes.    Do you have any questions about your discharge instructions: Diet   No. Medications  No. Follow up visit  No.  Do you have questions or concerns about your Care? No.  Actions: * If pain score is 4 or above: No action needed, pain <4.

## 2019-01-02 ENCOUNTER — Encounter: Payer: Self-pay | Admitting: Gastroenterology

## 2019-01-04 ENCOUNTER — Telehealth: Payer: Self-pay

## 2019-01-04 NOTE — Telephone Encounter (Signed)
-----   Message from [name], MD sent at 01/04/2019  7:17 AM EST ----- Joseph West,  I forgot to include in his path letter specific information about the polyps that I removed. Can you call him to let him know that the polyps were completely benign and he does not need any further colon cancer screening given his age. Save this as a phone note and route it to [place] as well to 'close the loop' for [place] results. Thanks  [name], See above. ----- Message ----- From: [name], RN Sent: 01/03/2019   2:21 PM EST To: [name], MD  Hey Dr. [name], I see the letter concerning his egd but nothing was mentioned about his colonoscopy. I am assuming d/t his age he will not have a recall but please advise. Thanks, [employer]

## 2019-01-04 NOTE — Telephone Encounter (Signed)
The pt was advised and The pt has been advised of the information and verbalized understanding.

## 2019-02-09 ENCOUNTER — Encounter: Payer: Self-pay | Admitting: Family Medicine

## 2019-02-09 ENCOUNTER — Other Ambulatory Visit: Payer: Self-pay

## 2019-02-09 ENCOUNTER — Ambulatory Visit (INDEPENDENT_AMBULATORY_CARE_PROVIDER_SITE_OTHER): Payer: Medicare Other | Admitting: Family Medicine

## 2019-02-09 VITALS — BP 130/82 | HR 75 | Temp 98.8°F | Wt 203.4 lb

## 2019-02-09 DIAGNOSIS — R05 Cough: Secondary | ICD-10-CM | POA: Diagnosis not present

## 2019-02-09 DIAGNOSIS — R059 Cough, unspecified: Secondary | ICD-10-CM

## 2019-02-09 NOTE — Progress Notes (Signed)
   Subjective:    Patient ID: Joseph West, male    DOB: 09-23-1941, 78 y.o.   MRN: [id number]  HPI He complains of a weeklong history of started with a cough but no associated fever, chills, shortness of breath, sore throat or earache.  Today he is feeling better.   Review of Systems     Objective:   Physical Exam Alert and in no distress. Tympanic membranes and canals are normal. Pharyngeal area is normal. Neck is supple without adenopathy or thyromegaly. Cardiac exam shows a regular sinus rhythm without murmurs or gallops. Lungs are clear to auscultation.        Assessment & Plan:  Cough I explained that since he is feeling better, no further intervention or evaluation is needed.  He was comfortable with that.

## 2019-03-29 ENCOUNTER — Encounter: Payer: Self-pay | Admitting: Family Medicine

## 2019-03-29 ENCOUNTER — Ambulatory Visit (INDEPENDENT_AMBULATORY_CARE_PROVIDER_SITE_OTHER): Payer: Medicare Other | Admitting: Family Medicine

## 2019-03-29 ENCOUNTER — Other Ambulatory Visit: Payer: Self-pay

## 2019-03-29 VITALS — BP 162/98 | HR 69 | Resp 16 | Wt 206.0 lb

## 2019-03-29 DIAGNOSIS — R030 Elevated blood-pressure reading, without diagnosis of hypertension: Secondary | ICD-10-CM | POA: Diagnosis not present

## 2019-03-29 NOTE — Progress Notes (Signed)
   Subjective:  Documentation for virtual telephone encounter. Refuses to do a video visit.  The patient was located at home.  2 patient identifiers were used. The provider was located in the office. The patient did consent to this visit and is aware of possible charges through their insurance for this visit.  The other persons participating in this telemedicine service were the patient's wife.    Patient ID: Joseph West, male    DOB: [birth year], 78 y.o.   MRN: [id number]  HPI Chief Complaint  Patient presents with  . bp issues    bp issues.running higher than normal. last couple days. feeling light headed   States he checked his BP yesterday because felt lightheaded and it was 170/104. Denies history of HTN but states it has been elevated on occasions in the past when he was under a lot of stress. States today his BP is 162/98 and pulse 69. States it seems to be improving. No longer having dizziness.   Denies fever, chills, dizziness, headache, vision disturbances, chest pain, palpitations, shortness of breath, abdominal pain, N/V/D, urinary symptoms, LE edema.  Denies any numbness, tingling or weakness.   Denies any new foods, new medications or alcohol use.   Smokes 2-3 cigarettes per day.   Reviewed allergies, medications, past medical, surgical, family, and social history.   Review of Systems Pertinent positives and negatives in the history of present illness.     Objective:   Physical Exam BP (!) 162/98   Pulse 69   Resp 16   Wt 206 lb (93.4 kg)   BMI 29.56 kg/m   Alert and oriented and in no acute distress. Normal speech and thought process.       Assessment & Plan:  Elevated blood pressure reading without diagnosis of hypertension  No red flag symptoms.  Advised to continue checking his BP at home and keep a record of these.  Recommend a low sodium diet. Cut back on Gatorade and salt use.  Strict precautions that if he notices any stroke like symptoms or  chest pain that he will need to be seen immediately.  Follow up next week with his PCP if BP is not improving.   Time spent on call was 12 minutes and in review of previous records 2 minutes total.  This virtual service is not related to other E/M service within previous 7 days.  99441 (5-71min) 99442 (11-79min) 99443 (21-78min)

## 2019-04-02 ENCOUNTER — Other Ambulatory Visit: Payer: Self-pay

## 2019-04-02 ENCOUNTER — Encounter: Payer: Self-pay | Admitting: Family Medicine

## 2019-04-02 ENCOUNTER — Ambulatory Visit (INDEPENDENT_AMBULATORY_CARE_PROVIDER_SITE_OTHER): Payer: Medicare Other | Admitting: Family Medicine

## 2019-04-02 VITALS — BP 158/83 | HR 73 | Temp 97.3°F | Wt 196.4 lb

## 2019-04-02 DIAGNOSIS — R42 Dizziness and giddiness: Secondary | ICD-10-CM | POA: Diagnosis not present

## 2019-04-02 DIAGNOSIS — R030 Elevated blood-pressure reading, without diagnosis of hypertension: Secondary | ICD-10-CM

## 2019-04-02 NOTE — Progress Notes (Signed)
   Subjective:    Patient ID: Joseph West, male    DOB: May 17, 1941, 78 y.o.   MRN: [id number]  HPI He is here for consult concerning his blood pressure and also being lightheaded.  He has checked his blood pressure recently and it is above his normal range.  He is not presently on any blood pressure medications. His main concern is dizziness that he was trying to relate to blood pressure.  The dizziness can occur when he gets out of bed but last for 20 minutes.  He describes this as differently than postural change that he has had in the past that usually lasts for a few seconds if he gets up too quickly.  He has had no associated blurred vision, double vision, weakness, heart rate changes.   Review of Systems     Objective:   Physical Exam Alert and in no distress.  EOMI.  No carotid bruits noted.  Tympanic membranes and canals are normal. Pharyngeal area is normal. Neck is supple without adenopathy or thyromegaly. Cardiac exam shows a regular sinus rhythm without murmurs or gallops. Lungs are clear to auscultation.        Assessment & Plan:  Elevated blood pressure reading without diagnosis of hypertension  Dizziness I explained that his blood pressure was not causing dizziness and it is transiently elevated.  We will need to follow-up on this in roughly 1 month.  He was comfortable with that. I then discussed the dizziness and at this point it is not associated with any particular issue.  He is to keep track of the dizziness and see if there are any other symptoms associated with that.  He was comfortable with that.  Recheck here 1 month.

## 2019-04-17 ENCOUNTER — Other Ambulatory Visit: Payer: Self-pay | Admitting: Gastroenterology

## 2019-04-19 ENCOUNTER — Telehealth: Payer: Self-pay | Admitting: Family Medicine

## 2019-04-19 MED ORDER — OMEPRAZOLE 40 MG PO CPDR: 40.0000 mg | DELAYED_RELEASE_CAPSULE | Freq: Every day | ORAL | 3 refills | Status: DC

## 2019-04-19 NOTE — Telephone Encounter (Signed)
Pharmacy states that Nizatidine 150 mg is unavailable and pt is seeking replacement please send in new RX to [place], [place]

## 2019-05-03 ENCOUNTER — Ambulatory Visit (INDEPENDENT_AMBULATORY_CARE_PROVIDER_SITE_OTHER): Payer: Medicare Other | Admitting: Family Medicine

## 2019-05-03 ENCOUNTER — Encounter: Payer: Self-pay | Admitting: Family Medicine

## 2019-05-03 ENCOUNTER — Other Ambulatory Visit: Payer: Self-pay

## 2019-05-03 VITALS — BP 138/82 | HR 79 | Temp 98.7°F | Wt 192.8 lb

## 2019-05-03 DIAGNOSIS — R030 Elevated blood-pressure reading, without diagnosis of hypertension: Secondary | ICD-10-CM

## 2019-05-03 NOTE — Patient Instructions (Signed)
Check your blood pressure weekly for the next couple of months.  Do it in the resting, sitting position with your arm at heart level.. We will set up a virtual visit for you in 2 months

## 2019-05-03 NOTE — Progress Notes (Signed)
   Subjective:    Patient ID: Joseph West, male    DOB: [birth year], 78 y.o.   MRN: [id number]  HPI He is here for recheck on his blood pressure.  He does have a cuff at home and has been taught how to properly use it.  It was compared to ours and is fairly accurate.  Over the last month or so he has made some changes in his lifestyle and is eating better as well as starting exercise.  His weight is down.   Review of Systems     Objective:   Physical Exam Alert and in no distress.  Blood pressure is recorded.       Assessment & Plan:  Elevated blood pressure reading without diagnosis of hypertension - Plan: His blood pressure and weight seem to be coming down.  Instead of placing him on medication he is to continue with his diet and exercise regimen, keep track of his blood sugars and recheck here in 2 months.  Check your blood pressure weekly for the next couple of months.  Do it in the resting, sitting position with your arm at heart level.. We will set up a virtual visit for you in 2 months

## 2019-07-03 ENCOUNTER — Other Ambulatory Visit: Payer: Self-pay

## 2019-07-03 ENCOUNTER — Encounter: Payer: Self-pay | Admitting: Family Medicine

## 2019-07-03 ENCOUNTER — Ambulatory Visit (INDEPENDENT_AMBULATORY_CARE_PROVIDER_SITE_OTHER): Payer: Medicare Other | Admitting: Family Medicine

## 2019-07-03 ENCOUNTER — Other Ambulatory Visit: Payer: Self-pay | Admitting: Family Medicine

## 2019-07-03 VITALS — BP 174/98 | HR 77 | Temp 98.2°F | Wt 196.6 lb

## 2019-07-03 DIAGNOSIS — I10 Essential (primary) hypertension: Secondary | ICD-10-CM

## 2019-07-03 MED ORDER — HYDROCHLOROTHIAZIDE 12.5 MG PO CAPS: 12.5000 mg | ORAL_CAPSULE | Freq: Every day | ORAL | 1 refills | Status: DC

## 2019-07-03 NOTE — Patient Instructions (Signed)
Check your blood pressure once a week.  Call me with your readings in about 4 weeks

## 2019-07-03 NOTE — Progress Notes (Signed)
   Subjective:    Patient ID: Joseph West, male    DOB: [birth year], 78 y.o.   MRN: [id number]  HPI He is here for recheck on his blood pressure.  He did bring his blood pressure cuff in for comparison.  He does have a previous history of elevated blood pressure but has not yet been placed on any medication.  Review of Systems     Objective:   Physical Exam Alert and in no distress.  Blood pressure is recorded and his machine is giving an accurate reading.       Assessment & Plan:   Encounter Diagnosis  Name Primary?  . Essential hypertension Yes  He was instructed on proper procedure for checking the blood pressure.  He will keep track of his pressure least on a weekly basis and call me in 1 month to let me know what his readings are.  He was comfortable with that.

## 2019-09-04 ENCOUNTER — Other Ambulatory Visit (INDEPENDENT_AMBULATORY_CARE_PROVIDER_SITE_OTHER): Payer: Medicare Other

## 2019-09-04 ENCOUNTER — Other Ambulatory Visit: Payer: Self-pay

## 2019-09-04 DIAGNOSIS — Z23 Encounter for immunization: Secondary | ICD-10-CM | POA: Diagnosis not present

## 2019-09-05 ENCOUNTER — Other Ambulatory Visit: Payer: Self-pay | Admitting: Family Medicine

## 2019-09-05 DIAGNOSIS — I10 Essential (primary) hypertension: Secondary | ICD-10-CM

## 2019-09-06 ENCOUNTER — Other Ambulatory Visit: Payer: Self-pay

## 2019-09-06 ENCOUNTER — Ambulatory Visit (INDEPENDENT_AMBULATORY_CARE_PROVIDER_SITE_OTHER): Payer: Medicare Other | Admitting: Family Medicine

## 2019-09-06 VITALS — BP 131/72 | HR 92 | Wt 196.0 lb

## 2019-09-06 DIAGNOSIS — R05 Cough: Secondary | ICD-10-CM | POA: Diagnosis not present

## 2019-09-06 DIAGNOSIS — R0989 Other specified symptoms and signs involving the circulatory and respiratory systems: Secondary | ICD-10-CM

## 2019-09-06 DIAGNOSIS — R059 Cough, unspecified: Secondary | ICD-10-CM

## 2019-09-06 NOTE — Progress Notes (Signed)
   Subjective:    Patient ID: Joseph West, male    DOB: November 24, 1940, 78 y.o.   MRN: [id number]  HPI Documentation for virtual telephone encounter. Documentation for virtual audio and video telecommunications through [place] encounter: The patient was located at home. The provider was located in the office. The patient did consent to this visit and is aware of possible charges through their insurance for this visit. The other persons participating in this telemedicine service were none. This virtual service is not related to other E/M service within previous 7 days. He complains of a 1 day history of chest congestion and cough but no fever, chills, sore throat, earache, smell or taste changes.  No exposure to Covid that he knows of.  He did get his flu shot on Tuesday.   Review of Systems     Objective:   Physical Exam Alert and in no distress.  No evidence of tachypnea noted visually.      Assessment & Plan:  Cough  Chest congestion Advised him to go for Covid testing tomorrow.  Recommend he treat his symptoms symptomatically.  Explained that this is probably a viral infection but we need to make sure about Covid first.  He was comfortable with that.

## 2019-09-07 ENCOUNTER — Other Ambulatory Visit: Payer: Self-pay

## 2019-09-07 DIAGNOSIS — Z20822 Contact with and (suspected) exposure to covid-19: Secondary | ICD-10-CM

## 2019-09-09 LAB — NOVEL CORONAVIRUS, NAA: SARS-CoV-2, NAA: NOT DETECTED

## 2019-11-05 ENCOUNTER — Other Ambulatory Visit: Payer: Self-pay | Admitting: Family Medicine

## 2019-11-05 DIAGNOSIS — I10 Essential (primary) hypertension: Secondary | ICD-10-CM

## 2019-11-05 MED ORDER — HYDROCHLOROTHIAZIDE 12.5 MG PO CAPS: ORAL_CAPSULE | ORAL | 1 refills | Status: DC

## 2019-11-05 NOTE — Telephone Encounter (Signed)
Pts wife left message and said he needs refill on this medaiction sent to the [facility] on [address]

## 2019-11-05 NOTE — Addendum Note (Signed)
Addended by: Denita Lung on: 11/05/2019 01:15 PM   Modules accepted: Orders

## 2019-12-05 ENCOUNTER — Telehealth: Payer: Self-pay | Admitting: Family Medicine

## 2019-12-05 NOTE — Telephone Encounter (Signed)
Pt wife was advised [facility]

## 2019-12-05 NOTE — Telephone Encounter (Signed)
He should be able to go ahead and give the shot but let him know that he has had difficulty with allergic reactions in the past and should probably wait 30 minutes after the shot to be safe

## 2019-12-05 NOTE — Telephone Encounter (Signed)
LVM for pt to call back to be advise of info concern the covid vaccine. [place]

## 2019-12-05 NOTE — Telephone Encounter (Signed)
Pt wife called and is wanting to know if pt is ok to get COVID shot since pt is allergic to shrimp, he is going to go get it at the [facility] hospital he can be reached at   [phone]

## 2019-12-14 ENCOUNTER — Ambulatory Visit: Payer: Medicare PPO | Attending: Internal Medicine

## 2019-12-14 DIAGNOSIS — Z23 Encounter for immunization: Secondary | ICD-10-CM

## 2019-12-17 NOTE — Progress Notes (Signed)
   Covid-19 Vaccination Clinic  Name:  Joseph West    MRN: [id number] DOB: [birth year]  12/14/2019  Mr. Melillo was observed post Covid-19 immunization for 15 minutes without incidence. He was provided with Vaccine Information Sheet and instruction to access the V-Safe system.   Mr. Sega was instructed to call 911 with any severe reactions post vaccine: Marland Kitchen Difficulty breathing  . Swelling of your face and throat  . A fast heartbeat  . A bad rash all over your body  . Dizziness and weakness    Immunizations Administered    Name Date Dose VIS Date Route   Moderna COVID-19 Vaccine 12/14/2019 11:12 AM 0.5 mL 10/23/2019 Intramuscular   Manufacturer: Moderna   Lot: [id number]   [id number]

## 2020-01-01 ENCOUNTER — Telehealth: Payer: Self-pay

## 2020-01-01 NOTE — Telephone Encounter (Signed)
Schedule him for an office visit.  If he has any results from the blood work from the [place], have him bring that.

## 2020-01-01 NOTE — Telephone Encounter (Signed)
Pt. Has been scheduled for an office visit tomorrow.

## 2020-01-01 NOTE — Telephone Encounter (Signed)
Pt. Care giver called stating that wanted him to come in to the office to get some blood work done, The [place] called him stating that his kidney function was not in the normal range but they did not remember getting it checked there and would like to have his kidney function checked here. If ok I can call her back to get him scheduled.

## 2020-01-02 ENCOUNTER — Other Ambulatory Visit: Payer: Self-pay

## 2020-01-02 ENCOUNTER — Encounter: Payer: Self-pay | Admitting: Family Medicine

## 2020-01-02 ENCOUNTER — Ambulatory Visit: Payer: Medicare PPO | Admitting: Family Medicine

## 2020-01-02 VITALS — BP 130/82 | HR 92 | Temp 96.4°F | Wt 204.0 lb

## 2020-01-02 DIAGNOSIS — Z79899 Other long term (current) drug therapy: Secondary | ICD-10-CM | POA: Diagnosis not present

## 2020-01-02 DIAGNOSIS — Z13220 Encounter for screening for lipoid disorders: Secondary | ICD-10-CM

## 2020-01-02 DIAGNOSIS — I10 Essential (primary) hypertension: Secondary | ICD-10-CM

## 2020-01-02 NOTE — Progress Notes (Signed)
   Subjective:    Patient ID: Joseph West, male    DOB: 10-May-1941, 79 y.o.   MRN: [id number]  HPI He is here for consult concerning potential abnormality of renal function.  He goes to the [place].  He was placed on Pravachol and then call back after blood work showed abnormal kidney function.  He does not have that information with him.   Review of Systems     Objective:   Physical Exam Alert and in no distress otherwise not examined       Assessment & Plan:  Essential hypertension - Plan: CBC with Differential/Platelet, Comprehensive metabolic panel  Encounter for long-term (current) use of medications - Plan: CBC with Differential/Platelet, Comprehensive metabolic panel, Lipid panel  Screening for lipid disorders - Plan: Lipid panel I explained that the easiest thing to do would be to repeat the blood work since we do not have a copy of that.  He was comfortable with that.

## 2020-01-03 LAB — COMPREHENSIVE METABOLIC PANEL
ALT: 8 IU/L (ref 0–44)
AST: 17 IU/L (ref 0–40)
Albumin/Globulin Ratio: 1.1 — ABNORMAL LOW (ref 1.2–2.2)
Albumin: 4.2 g/dL (ref 3.7–4.7)
Alkaline Phosphatase: 113 IU/L (ref 39–117)
BUN/Creatinine Ratio: 11 (ref 10–24)
BUN: 17 mg/dL (ref 8–27)
Bilirubin Total: 0.4 mg/dL (ref 0.0–1.2)
CO2: 23 mmol/L (ref 20–29)
Calcium: 9.9 mg/dL (ref 8.6–10.2)
Chloride: 104 mmol/L (ref 96–106)
Creatinine, Ser: 1.48 mg/dL — ABNORMAL HIGH (ref 0.76–1.27)
GFR calc Af Amer: 51 mL/min/{1.73_m2} — ABNORMAL LOW (ref >59)
GFR calc non Af Amer: 44 mL/min/{1.73_m2} — ABNORMAL LOW (ref >59)
Globulin, Total: 3.9 g/dL (ref 1.5–4.5)
Glucose: 98 mg/dL (ref 65–99)
Potassium: 4.7 mmol/L (ref 3.5–5.2)
Sodium: 141 mmol/L (ref 134–144)
Total Protein: 8.1 g/dL (ref 6.0–8.5)

## 2020-01-03 LAB — CBC WITH DIFFERENTIAL/PLATELET
Basophils Absolute: 0.1 10*3/uL (ref 0.0–0.2)
Basos: 1 %
EOS (ABSOLUTE): 0.4 10*3/uL (ref 0.0–0.4)
Eos: 4 %
Hematocrit: 44.8 % (ref 37.5–51.0)
Hemoglobin: 14.6 g/dL (ref 13.0–17.7)
Immature Grans (Abs): 0 10*3/uL (ref 0.0–0.1)
Immature Granulocytes: 0 %
Lymphocytes Absolute: 4 10*3/uL — ABNORMAL HIGH (ref 0.7–3.1)
Lymphs: 46 %
MCH: 27.3 pg (ref 26.6–33.0)
MCHC: 32.6 g/dL (ref 31.5–35.7)
MCV: 84 fL (ref 79–97)
Monocytes Absolute: 0.6 10*3/uL (ref 0.1–0.9)
Monocytes: 7 %
Neutrophils Absolute: 3.6 10*3/uL (ref 1.4–7.0)
Neutrophils: 42 %
Platelets: 312 10*3/uL (ref 150–450)
RBC: 5.34 x10E6/uL (ref 4.14–5.80)
RDW: 14.5 % (ref 11.6–15.4)
WBC: 8.5 10*3/uL (ref 3.4–10.8)

## 2020-01-03 LAB — LIPID PANEL
Chol/HDL Ratio: 3.8 ratio (ref 0.0–5.0)
Cholesterol, Total: 207 mg/dL — ABNORMAL HIGH (ref 100–199)
HDL: 54 mg/dL (ref >39)
LDL Chol Calc (NIH): 138 mg/dL — ABNORMAL HIGH (ref 0–99)
Triglycerides: 84 mg/dL (ref 0–149)
VLDL Cholesterol Cal: 15 mg/dL (ref 5–40)

## 2020-01-04 ENCOUNTER — Telehealth: Payer: Self-pay

## 2020-01-04 NOTE — Telephone Encounter (Signed)
Pt. Wife called had some questions about Mr. Weingartz lab results. I made her aware of his results and the recommendations.

## 2020-01-11 ENCOUNTER — Ambulatory Visit: Payer: Medicare PPO

## 2020-01-18 ENCOUNTER — Ambulatory Visit: Payer: Medicare PPO | Attending: Internal Medicine

## 2020-01-18 DIAGNOSIS — Z23 Encounter for immunization: Secondary | ICD-10-CM

## 2020-01-18 NOTE — Progress Notes (Signed)
   Covid-19 Vaccination Clinic  Name:  Joseph West    MRN: [id number] DOB: Mar 26, 1941  01/18/2020  [name] was observed post Covid-19 immunization for 15 minutes without incidence. He was provided with Vaccine Information Sheet and instruction to access the V-Safe system.   [name] was instructed to call 911 with any severe reactions post vaccine: Marland Kitchen Difficulty breathing  . Swelling of your face and throat  . A fast heartbeat  . A bad rash all over your body  . Dizziness and weakness    Immunizations Administered    Name Date Dose VIS Date Route   Moderna COVID-19 Vaccine 01/18/2020  9:43 AM 0.5 mL 10/23/2019 Intramuscular   Manufacturer: Moderna   Lot: [id number]   [id number]

## 2020-02-12 ENCOUNTER — Ambulatory Visit: Payer: Medicare PPO | Admitting: Family Medicine

## 2020-02-12 ENCOUNTER — Other Ambulatory Visit: Payer: Self-pay

## 2020-02-12 ENCOUNTER — Encounter: Payer: Self-pay | Admitting: Family Medicine

## 2020-02-12 VITALS — BP 134/82 | HR 77 | Temp 97.8°F | Ht 71.0 in | Wt 204.4 lb

## 2020-02-12 DIAGNOSIS — E785 Hyperlipidemia, unspecified: Secondary | ICD-10-CM

## 2020-02-12 DIAGNOSIS — H269 Unspecified cataract: Secondary | ICD-10-CM | POA: Diagnosis not present

## 2020-02-12 DIAGNOSIS — F17200 Nicotine dependence, unspecified, uncomplicated: Secondary | ICD-10-CM | POA: Diagnosis not present

## 2020-02-12 DIAGNOSIS — K219 Gastro-esophageal reflux disease without esophagitis: Secondary | ICD-10-CM | POA: Diagnosis not present

## 2020-02-12 DIAGNOSIS — I10 Essential (primary) hypertension: Secondary | ICD-10-CM | POA: Diagnosis not present

## 2020-02-12 DIAGNOSIS — N1831 Chronic kidney disease, stage 3a: Secondary | ICD-10-CM

## 2020-02-12 DIAGNOSIS — J301 Allergic rhinitis due to pollen: Secondary | ICD-10-CM

## 2020-02-12 DIAGNOSIS — I714 Abdominal aortic aneurysm, without rupture, unspecified: Secondary | ICD-10-CM

## 2020-02-12 DIAGNOSIS — M1990 Unspecified osteoarthritis, unspecified site: Secondary | ICD-10-CM

## 2020-02-12 DIAGNOSIS — Z79899 Other long term (current) drug therapy: Secondary | ICD-10-CM | POA: Diagnosis not present

## 2020-02-12 MED ORDER — PRAVASTATIN SODIUM 20 MG PO TABS: 20.0000 mg | ORAL_TABLET | Freq: Every day | ORAL | 3 refills | Status: DC

## 2020-02-12 MED ORDER — OMEPRAZOLE 40 MG PO CPDR: 40.0000 mg | DELAYED_RELEASE_CAPSULE | Freq: Every day | ORAL | 3 refills | Status: DC

## 2020-02-12 MED ORDER — HYDROCHLOROTHIAZIDE 12.5 MG PO CAPS: ORAL_CAPSULE | ORAL | 3 refills | Status: DC

## 2020-02-12 NOTE — Patient Instructions (Signed)
  Mr. Ditommaso , Thank you for taking time to come for your Medicare Wellness Visit. I appreciate your ongoing commitment to your health goals. Please review the following plan we discussed and let me know if I can assist you in the future.   These are the goals we discussed: Continue with your exercise.  Try to cut back on use of Prilosec.  Continue to be followed by your ophthalmologist.  Take Tylenol when you have some arthritis pains.  Time to throw the cigarettes away! This is a list of the screening recommended for you and due dates:  Health Maintenance  Topic Date Due  . Tetanus Vaccine  01/31/2023  . Flu Shot  Completed  . Pneumonia vaccines  Completed

## 2020-02-12 NOTE — Progress Notes (Addendum)
Joseph West is a 79 y.o. male who presents for annual wellness visit and follow-up on chronic medical conditions.  He has started smoking again but usually only 4 cigarettes/day.  He does have reflux and uses Prilosec on a regular basis.  Arthritis causes very little difficulty as he does not swim and play golf.  His allergies are under good control.  He does have a previous history of AAA.  He sees his eye doctor regularly does have cataracts but apparently they are not ready to be removed.  Continues on HCTZ as well as pravastatin.  He has had no difficulty with these medications.  He does take a multivitamin regularly.   Immunizations and Health Maintenance Immunization History  Administered Date(s) Administered  . Fluad Quad(high Dose 65+) 09/04/2019  . Influenza, High Dose Seasonal PF 09/04/2013, 08/28/2015, 10/08/2016, 10/05/2017  . Moderna SARS-COVID-2 Vaccination 12/14/2019, 01/18/2020  . Pneumococcal Conjugate-13 08/28/2015  . Pneumococcal Polysaccharide-23 03/25/2006  . Td 11/22/2000  . Tdap 01/30/2013  . Zoster 05/10/2006   There are no preventive care reminders to display for this patient.  Last colonoscopy:N/A\ Last PSA:N/a Dentist:? Ophtho:? Exercise: Swimming and golf  Other doctors caring for patient include:VA Advanced Directives:yes ;copy asked for Does Patient Have a Medical Advance Directive?: Yes Type of Advance Directive: [place] will Does patient want to make changes to medical advance directive?: No - Patient declined Copy of [place] in Chart?: No - copy requested  Depression screen:  See questionnaire below.     Depression screen [facility] 2/9 02/12/2020 12/20/2017 10/08/2016 08/28/2015 08/28/2015  Decreased Interest 0 0 0 0 0  Down, Depressed, Hopeless 0 0 0 0 0  PHQ - 2 Score 0 0 0 0 0    Fall Screen: See Questionaire below.   Fall Risk  02/12/2020 12/20/2017 10/08/2016 08/28/2015 08/28/2015  Falls in the past  year? 0 No No No No  Number falls in past yr: 0 - - - -  Injury with Fall? 0 - - - -    ADL screen:  See questionnaire below.  Functional Status Survey: Is the patient deaf or have difficulty hearing?: No Does the patient have difficulty seeing, even when wearing glasses/contacts?: Yes(little blurry) Does the patient have difficulty concentrating, remembering, or making decisions?: No Does the patient have difficulty walking or climbing stairs?: No Does the patient have difficulty dressing or bathing?: No Does the patient have difficulty doing errands alone such as visiting a doctor's office or shopping?: No   Review of Systems  Constitutional: -, -unexpected weight change, -anorexia, -fatigue Allergy: -sneezing, -itching, -congestion Dermatology: denies changing moles, rash, lumps ENT: -runny nose, -ear pain, -sore throat,  Cardiology:  -chest pain, -palpitations, -orthopnea, Respiratory: -cough, -shortness of breath, -dyspnea on exertion, -wheezing,  Gastroenterology: -abdominal pain, -nausea, -vomiting, -diarrhea, -constipation, -dysphagia Hematology: -bleeding or bruising problems Musculoskeletal: -arthralgias, -myalgias, -joint swelling, -back pain, - Ophthalmology: -vision changes,  Urology: -dysuria, -difficulty urinating,  -urinary frequency, -urgency, incontinence Neurology: -, -numbness, , -memory loss, -falls, -dizziness    PHYSICAL EXAM:  BP 134/82   Pulse 77   Temp 97.8 F (36.6 C)   Ht 5\' 11"  (1.803 m)   Wt 204 lb 6.4 oz (92.7 kg)   BMI 28.51 kg/m   General Appearance: Alert, cooperative, no distress, appears stated age Head: Normocephalic, without obvious abnormality, atraumatic Eyes: PERRL, conjunctiva/corneas clear, EOM's intact,  Ears: Normal TM's and external ear canals Nose: Nares normal, mucosa normal, no drainage or  sinus   tenderness Throat: Lips, mucosa, and tongue normal; teeth and gums normal Neck: Supple, no lymphadenopathy, thyroid:no  enlargement/tenderness/nodules; no carotid bruit or JVD Lungs: Clear to auscultation bilaterally without wheezes, rales or ronchi; respirations unlabored Heart: Regular rate and rhythm, S1 and S2 normal, no murmur, rub or gallop Abdomen: Soft, non-tender, nondistended, normoactive bowel sounds, no masses, no hepatosplenomegaly Skin: Skin color, texture, turgor normal, no rashes or lesions Lymph nodes: Cervical, supraclavicular, and axillary nodes normal Neurologic: CNII-XII intact, normal strength, sensation and gait; reflexes 2+ and symmetric throughout   Psych: Normal mood, affect, hygiene and grooming  ASSESSMENT/PLAN: Essential hypertension - Plan: hydrochlorothiazide (MICROZIDE) 12.5 MG capsule, CBC with Differential/Platelet, Comprehensive metabolic panel  Encounter for long-term (current) use of medications - Plan: CBC with Differential/Platelet, Comprehensive metabolic panel, Lipid panel  Abdominal aortic aneurysm (AAA) without rupture (HCC)  Gastroesophageal reflux disease without esophagitis - Plan: omeprazole (PRILOSEC) 40 MG capsule  Seasonal allergic rhinitis due to pollen  Arthritis  Smoker  Cataract of both eyes, unspecified cataract type  Hyperlipidemia, unspecified hyperlipidemia type - Plan: pravastatin (PRAVACHOL) 20 MG tablet, Lipid panel  Stage 3a chronic kidney disease Blood work shows evidence of CKD recommended at least 30 minutes of aerobic activity at least 5 days/week; reviewed; healthy diet and alcohol recommendations (less than or equal to 2 drinks/day) reviewed; regular seatbelt use;  Immunization recommendations discussed.  Time to throw away the cigarettes.  Also recommend using the Prilosec on an as-needed basis.  Discussed Tylenol for pain relief from his arthritis.  Continue follow-up with ophthalmology.  Discussed his AAA and repeat in several years.  Use Claritin or Allegra for allergy symptoms. Medicare Attestation I have personally reviewed: The  patient's medical and social history Their use of alcohol, tobacco or illicit drugs Their current medications and supplements The patient's functional ability including ADLs,fall risks, home safety risks, cognitive, and hearing and visual impairment Diet and physical activities Evidence for depression or mood disorders  The patient's weight, height, and BMI have been recorded in the chart.  I have made referrals, counseling, and provided education to the patient based on review of the above and I have provided the patient with a written personalized care plan for preventive services.     Jill Alexanders, MD   02/13/2020

## 2020-02-13 DIAGNOSIS — N1831 Chronic kidney disease, stage 3a: Secondary | ICD-10-CM | POA: Insufficient documentation

## 2020-02-13 LAB — COMPREHENSIVE METABOLIC PANEL
ALT: 10 IU/L (ref 0–44)
AST: 20 IU/L (ref 0–40)
Albumin/Globulin Ratio: 1 — ABNORMAL LOW (ref 1.2–2.2)
Albumin: 4 g/dL (ref 3.7–4.7)
Alkaline Phosphatase: 113 IU/L (ref 39–117)
BUN/Creatinine Ratio: 11 (ref 10–24)
BUN: 16 mg/dL (ref 8–27)
Bilirubin Total: <0.2 mg/dL (ref 0.0–1.2)
CO2: 24 mmol/L (ref 20–29)
Calcium: 9.6 mg/dL (ref 8.6–10.2)
Chloride: 104 mmol/L (ref 96–106)
Creatinine, Ser: 1.47 mg/dL — ABNORMAL HIGH (ref 0.76–1.27)
GFR calc Af Amer: 51 mL/min/{1.73_m2} — ABNORMAL LOW (ref >59)
GFR calc non Af Amer: 44 mL/min/{1.73_m2} — ABNORMAL LOW (ref >59)
Globulin, Total: 4 g/dL (ref 1.5–4.5)
Glucose: 100 mg/dL — ABNORMAL HIGH (ref 65–99)
Potassium: 4.3 mmol/L (ref 3.5–5.2)
Sodium: 140 mmol/L (ref 134–144)
Total Protein: 8 g/dL (ref 6.0–8.5)

## 2020-02-13 LAB — CBC WITH DIFFERENTIAL/PLATELET
Basophils Absolute: 0.1 10*3/uL (ref 0.0–0.2)
Basos: 1 %
EOS (ABSOLUTE): 0.4 10*3/uL (ref 0.0–0.4)
Eos: 5 %
Hematocrit: 44.3 % (ref 37.5–51.0)
Hemoglobin: 14.5 g/dL (ref 13.0–17.7)
Immature Grans (Abs): 0 10*3/uL (ref 0.0–0.1)
Immature Granulocytes: 0 %
Lymphocytes Absolute: 4 10*3/uL — ABNORMAL HIGH (ref 0.7–3.1)
Lymphs: 54 %
MCH: 27.7 pg (ref 26.6–33.0)
MCHC: 32.7 g/dL (ref 31.5–35.7)
MCV: 85 fL (ref 79–97)
Monocytes Absolute: 0.5 10*3/uL (ref 0.1–0.9)
Monocytes: 7 %
Neutrophils Absolute: 2.5 10*3/uL (ref 1.4–7.0)
Neutrophils: 33 %
Platelets: 276 10*3/uL (ref 150–450)
RBC: 5.24 x10E6/uL (ref 4.14–5.80)
RDW: 15.3 % (ref 11.6–15.4)
WBC: 7.5 10*3/uL (ref 3.4–10.8)

## 2020-02-13 LAB — LIPID PANEL
Chol/HDL Ratio: 4.3 ratio (ref 0.0–5.0)
Cholesterol, Total: 204 mg/dL — ABNORMAL HIGH (ref 100–199)
HDL: 47 mg/dL (ref >39)
LDL Chol Calc (NIH): 134 mg/dL — ABNORMAL HIGH (ref 0–99)
Triglycerides: 127 mg/dL (ref 0–149)
VLDL Cholesterol Cal: 23 mg/dL (ref 5–40)

## 2020-04-24 ENCOUNTER — Encounter: Payer: Self-pay | Admitting: Family Medicine

## 2020-04-24 ENCOUNTER — Other Ambulatory Visit: Payer: Self-pay

## 2020-04-24 ENCOUNTER — Telehealth: Payer: Medicare PPO | Admitting: Family Medicine

## 2020-04-24 VITALS — BP 131/80 | Ht 71.0 in | Wt 204.0 lb

## 2020-04-24 DIAGNOSIS — R059 Cough, unspecified: Secondary | ICD-10-CM

## 2020-04-24 DIAGNOSIS — R05 Cough: Secondary | ICD-10-CM

## 2020-04-24 DIAGNOSIS — I10 Essential (primary) hypertension: Secondary | ICD-10-CM

## 2020-04-24 NOTE — Patient Instructions (Signed)
Drink plenty of fluids to keep secretions thin. STOP taking the Dayquil cough syrup that you have been taking--you don't need the nasal decongestant and it may raise your blood pressure.    INSTEAD, I recommend taking Mucinex DM 12 hour, and take it twice daily (vs taking Robitussin DM, which has the same ingredients, but you need to take it every 4 hours). This contains an expectorant to loosen up the mucus, and has the SAME cough suppressant that is in the Dayquil (so be sure NOT to take both!).  If you have runny nose, I recommend using an antihistamine (such as claritin), as this may dry up the nose without raising blood pressure (and lasts all day).  Be sure to follow up with [place] if your cough persists, worsens, if you develop fever, shortness of breath, or if you start coughing up any yellow-green mucus or any blood.  These symptoms definitely warrant further evaluation.  It was a pleasure meeting you, and I hope you feel better soon!

## 2020-04-24 NOTE — Progress Notes (Signed)
Start time: 12:30 End time: 12:49   Virtual Visit via Video Note  I connected with Joseph West on 04/24/20 at  1:30 PM EDT by a video enabled telemedicine application and verified that I am speaking with the correct person using two identifiers.  Location: Patient: at home, wife Provider: office   I discussed the limitations of evaluation and management by telemedicine and the availability of in person appointments. The patient expressed understanding and agreed to proceed.  History of Present Illness:  Chief Complaint  Patient presents with   Cough    VIRTUAL cough since Sunday. Took grandson to pool Sunday and he came back and was damp and was in the [facility] and he became hoarse and thinks it brought on cough. No fever, body aches or chills. Feels fine except for cough.    Last Saturday (5 days ago) he was out at the pool in the rain with his grandson. He mostly stayed under cover, but thinks due to the dampness, it started his cough. He is currently complaining of nasal congestion and cough.  He denies runny nose, postnasal drainage. Cough sounds dry, feels like he can't get the mucus up. Denies DOE/SOB  No fever or chills, was slightly sweaty but thinks it was related to using a neck rest at night (slightly sweaty at the neck only).  Has had COVID vaccines. No known sick contacts.  He stayed far away from others, and wears a mask.  He is using an OTC medication, dayquil cough --wife read the ingredients: Acetaminophen, dextromethorphan, phenylephrine. Last taken 2 am.  PMH, PSH, SH reviewed  Outpatient Encounter Medications as of 04/24/2020  Medication Sig   Dextromethorphan HBr (VICKS DAYQUIL COUGH) 15 MG/15ML LIQD Take 30 mLs by mouth daily.   hydrochlorothiazide (MICROZIDE) 12.5 MG capsule TAKE 1 CAPSULE(12.5 MG) BY MOUTH DAILY   Multiple Vitamin (MULTIVITAMIN) capsule Take 1 capsule by mouth daily.   omeprazole (PRILOSEC) 40 MG capsule Take 1 capsule (40 mg total) by  mouth daily.   pravastatin (PRAVACHOL) 20 MG tablet Take 1 tablet (20 mg total) by mouth daily.   [DISCONTINUED] Phenylephrine-DM-GG (MUCINEX FAST-MAX CONGEST COUGH) 2.5-5-100 MG/5ML LIQD Take by mouth.   No facility-administered encounter medications on file as of 04/24/2020.   No Known Allergies  Immunization History  Administered Date(s) Administered   Fluad Quad(high Dose 65+) 09/04/2019   Influenza, High Dose Seasonal PF 09/04/2013, 08/28/2015, 10/08/2016, 10/05/2017   Moderna SARS-COVID-2 Vaccination 12/14/2019, 01/18/2020   Pneumococcal Conjugate-13 08/28/2015   Pneumococcal Polysaccharide-23 03/25/2006   Td 11/22/2000   Tdap 01/30/2013   Zoster 05/10/2006   ROS:  No known fever. Cough and congestion per HPI. No nausea, vomiting, diarrhea, normal appetite. Normal smell, taste. No myalgias or fatigue. No chest pain, shortness of breath, rash or other concerns.   Observations/Objective:  BP 131/80 Comment: got cuff to work shortly after phone call from nurse   Ht 5\' 11"  (1.803 m)    Wt 204 lb (92.5 kg)    BMI 28.45 kg/m    Well-appearing, pleasant male, in no distress He is speaking easily in full sentences. Periodically coughed--sometimes sounded dry, other times sounded slightly wet. He is alert, oriented, and in good spirits. Exam is limited due to virtual nature of the visit  Assessment and Plan:  Cough - Ddx reviewed, supportive measures reviewed. f/u if fever, worsening, SOB, purulent mucus/phlegm  Essential hypertension - BP okay today. Advised NOT to use cough syrup with phenylephrine (currently out of his system)  Mucinex DM 12 twice daily Do NOT take with syrup. claritin if needed   Follow Up Instructions:    I discussed the assessment and treatment plan with the patient. The patient was provided an opportunity to ask questions and all were answered. The patient agreed with the plan and demonstrated an understanding of the instructions.    The patient was advised to call back or seek an in-person evaluation if the symptoms worsen or if the condition fails to improve as anticipated.  I provided 19  minutes of video face-to-face time during this encounter. Additional time spent in chart review and documentation   Vikki Ports, MD

## 2020-08-14 DIAGNOSIS — H43821 Vitreomacular adhesion, right eye: Secondary | ICD-10-CM | POA: Diagnosis not present

## 2020-08-14 DIAGNOSIS — Z01818 Encounter for other preprocedural examination: Secondary | ICD-10-CM | POA: Diagnosis not present

## 2020-08-14 DIAGNOSIS — H25813 Combined forms of age-related cataract, bilateral: Secondary | ICD-10-CM | POA: Diagnosis not present

## 2020-08-14 DIAGNOSIS — H25812 Combined forms of age-related cataract, left eye: Secondary | ICD-10-CM | POA: Diagnosis not present

## 2020-08-18 DIAGNOSIS — H2512 Age-related nuclear cataract, left eye: Secondary | ICD-10-CM | POA: Diagnosis not present

## 2020-08-18 DIAGNOSIS — H25812 Combined forms of age-related cataract, left eye: Secondary | ICD-10-CM | POA: Diagnosis not present

## 2020-10-06 ENCOUNTER — Other Ambulatory Visit (INDEPENDENT_AMBULATORY_CARE_PROVIDER_SITE_OTHER): Payer: Medicare PPO

## 2020-10-06 ENCOUNTER — Other Ambulatory Visit: Payer: Self-pay

## 2020-10-06 DIAGNOSIS — Z23 Encounter for immunization: Secondary | ICD-10-CM | POA: Diagnosis not present

## 2020-11-06 DIAGNOSIS — H2000 Unspecified acute and subacute iridocyclitis: Secondary | ICD-10-CM | POA: Diagnosis not present

## 2020-11-26 DIAGNOSIS — H2000 Unspecified acute and subacute iridocyclitis: Secondary | ICD-10-CM | POA: Diagnosis not present

## 2020-12-15 DIAGNOSIS — H0102A Squamous blepharitis right eye, upper and lower eyelids: Secondary | ICD-10-CM | POA: Diagnosis not present

## 2021-01-01 DIAGNOSIS — H2000 Unspecified acute and subacute iridocyclitis: Secondary | ICD-10-CM | POA: Diagnosis not present

## 2021-01-01 DIAGNOSIS — H25811 Combined forms of age-related cataract, right eye: Secondary | ICD-10-CM | POA: Diagnosis not present

## 2021-01-28 DIAGNOSIS — H2000 Unspecified acute and subacute iridocyclitis: Secondary | ICD-10-CM | POA: Diagnosis not present

## 2021-02-12 ENCOUNTER — Other Ambulatory Visit: Payer: Self-pay

## 2021-02-12 ENCOUNTER — Encounter: Payer: Self-pay | Admitting: Family Medicine

## 2021-02-12 ENCOUNTER — Ambulatory Visit (INDEPENDENT_AMBULATORY_CARE_PROVIDER_SITE_OTHER): Payer: Medicare HMO | Admitting: Family Medicine

## 2021-02-12 VITALS — BP 138/84 | HR 64 | Temp 97.9°F | Ht 70.75 in | Wt 200.2 lb

## 2021-02-12 DIAGNOSIS — I714 Abdominal aortic aneurysm, without rupture, unspecified: Secondary | ICD-10-CM

## 2021-02-12 DIAGNOSIS — H409 Unspecified glaucoma: Secondary | ICD-10-CM

## 2021-02-12 DIAGNOSIS — Z9842 Cataract extraction status, left eye: Secondary | ICD-10-CM

## 2021-02-12 DIAGNOSIS — K219 Gastro-esophageal reflux disease without esophagitis: Secondary | ICD-10-CM | POA: Diagnosis not present

## 2021-02-12 DIAGNOSIS — E785 Hyperlipidemia, unspecified: Secondary | ICD-10-CM

## 2021-02-12 DIAGNOSIS — M1990 Unspecified osteoarthritis, unspecified site: Secondary | ICD-10-CM

## 2021-02-12 DIAGNOSIS — J301 Allergic rhinitis due to pollen: Secondary | ICD-10-CM | POA: Diagnosis not present

## 2021-02-12 DIAGNOSIS — Z0000 Encounter for general adult medical examination without abnormal findings: Secondary | ICD-10-CM | POA: Diagnosis not present

## 2021-02-12 DIAGNOSIS — I10 Essential (primary) hypertension: Secondary | ICD-10-CM | POA: Diagnosis not present

## 2021-02-12 DIAGNOSIS — F17200 Nicotine dependence, unspecified, uncomplicated: Secondary | ICD-10-CM | POA: Diagnosis not present

## 2021-02-12 DIAGNOSIS — N1831 Chronic kidney disease, stage 3a: Secondary | ICD-10-CM | POA: Diagnosis not present

## 2021-02-12 MED ORDER — HYDROCHLOROTHIAZIDE 12.5 MG PO CAPS: ORAL_CAPSULE | ORAL | 3 refills | Status: DC

## 2021-02-12 MED ORDER — PRAVASTATIN SODIUM 20 MG PO TABS: 20.0000 mg | ORAL_TABLET | Freq: Every day | ORAL | 3 refills | Status: DC

## 2021-02-12 NOTE — Progress Notes (Signed)
Joseph West is a 80 y.o. male who presents for annual wellness visit,CPE and follow-up on chronic medical conditions.  He continues on HCTZ for his blood pressure.  He is now smoking only 3 cigarettes/day.  He does have reflux disease and has been taking Prilosec on a daily basis.  Continues to do quite nicely on Pravachol for his hyperlipidemia.  He does have glaucoma and apparently his ophthalmologist is having him use meloxicam.  He has also had left cataract surgery and the right cataract will probably be done a later date.  He also has a history of glaucoma.  Review of the record indicates abdominal aortic aneurysm and need for follow-up ultrasound on that.  He also has evidence of CKD.  He does have underlying allergies but they are causing very little difficulty.  Immunizations were reviewed.  He does keep himself quite physically active.  His marriage and home life are going well.   Immunizations and Health Maintenance Immunization History  Administered Date(s) Administered  . Fluad Quad(high Dose 65+) 09/04/2019, 10/06/2020  . Influenza, High Dose Seasonal PF 09/04/2013, 08/28/2015, 10/08/2016, 10/05/2017  . Influenza-Unspecified 08/08/2018  . Moderna Sars-Covid-2 Vaccination 12/14/2019, 01/18/2020, 09/18/2020  . Pneumococcal Conjugate-13 08/28/2015  . Pneumococcal Polysaccharide-23 03/25/2006  . Td 11/22/2000  . Tdap 01/30/2013  . Zoster 05/10/2006   There are no preventive care reminders to display for this patient.  Last colonoscopy:  12/26/18 Last PSA: never Dentist: over year ago Ophtho: Q month Exercise: swimming three days week for one hour  Other doctors caring for patient include: Dr. [name]  GI, Dr. Manuella Ghazi eye  Advanced Directives: Does Patient Have a Medical Advance Directive?: Yes Type of Advance Directive: Living will,Healthcare Power of Attorney Does patient want to make changes to medical advance directive?: No - Patient declined Copy of [place] in Chart?: Yes - validated most recent copy scanned in chart (See row information)  Depression screen:  See questionnaire below.     Depression screen [facility] 2/9 02/12/2021 02/12/2020 12/20/2017 10/08/2016 08/28/2015  Decreased Interest 0 0 0 0 0  Down, Depressed, Hopeless 0 0 0 0 0  PHQ - 2 Score 0 0 0 0 0    Fall Screen: See Questionaire below.   Fall Risk  02/12/2021 02/12/2020 12/20/2017 10/08/2016 08/28/2015  Falls in the past year? 0 0 No No No  Number falls in past yr: 0 0 - - -  Injury with Fall? 0 0 - - -  Follow up Falls evaluation completed - - - -    ADL screen:  See questionnaire below.  Functional Status Survey: Is the patient deaf or have difficulty hearing?: No Does the patient have difficulty seeing, even when wearing glasses/contacts?: No Does the patient have difficulty concentrating, remembering, or making decisions?: No Does the patient have difficulty walking or climbing stairs?: No Does the patient have difficulty dressing or bathing?: No Does the patient have difficulty doing errands alone such as visiting a doctor's office or shopping?: No   Review of Systems  Constitutional: -, -unexpected weight change, -anorexia, -fatigue Allergy: -sneezing, -itching, -congestion Dermatology: denies changing moles, rash, lumps ENT: -runny nose, -ear pain, -sore throat,  Cardiology:  -chest pain, -palpitations, -orthopnea, Respiratory: -cough, -shortness of breath, -dyspnea on exertion, -wheezing,  Gastroenterology: -abdominal pain, -nausea, -vomiting, -diarrhea, -constipation, -dysphagia Hematology: -bleeding or bruising problems Musculoskeletal: -arthralgias, -myalgias, -joint swelling, -back pain, - Ophthalmology: -vision changes,  Urology: -dysuria, -difficulty urinating,  -urinary frequency, -urgency, incontinence Neurology: -, -numbness, , -  memory loss, -falls, -dizziness  PHYSICAL EXAM:  General Appearance: Alert, cooperative, no distress, appears stated  age Head: Normocephalic, without obvious abnormality, atraumatic Eyes: PERRL, conjunctiva/corneas clear, EOM's intact,  Ears: Normal TM's and external ear canals Nose: Nares normal, mucosa normal, no drainage or sinus   tenderness Throat: Lips, mucosa, and tongue normal; teeth and gums normal Neck: Supple, no lymphadenopathy, thyroid:no enlargement/tenderness/nodules; no carotid bruit or JVD Lungs: Clear to auscultation bilaterally without wheezes, rales or ronchi; respirations unlabored Heart: Regular rate and rhythm, S1 and S2 normal, no murmur, rub or gallop Abdomen: Soft, non-tender, nondistended, normoactive bowel sounds, no masses, no hepatosplenomegaly Extremities: No clubbing, cyanosis or edema Pulses: 2+ and symmetric all extremities Skin: Skin color, texture, turgor normal, no rashes or lesions Lymph nodes: Cervical, supraclavicular, and axillary nodes normal Neurologic: CNII-XII intact, normal strength, sensation and gait; reflexes 2+ and symmetric throughout   Psych: Normal mood, affect, hygiene and grooming  ASSESSMENT/PLAN: Routine general medical examination at a health care facility - Plan: CBC with Differential/Platelet, Comprehensive metabolic panel, Lipid panel  Abdominal aortic aneurysm (AAA) without rupture ([place]) - Plan: US AORTA  Essential hypertension - Plan: CBC with Differential/Platelet, Comprehensive metabolic panel, hydrochlorothiazide (MICROZIDE) 12.5 MG capsule  Gastroesophageal reflux disease without esophagitis  Seasonal allergic rhinitis due to pollen  Smoker  Hyperlipidemia, unspecified hyperlipidemia type - Plan: Lipid panel, pravastatin (PRAVACHOL) 20 MG tablet  Stage 3a chronic kidney disease ([place]) - Plan: Comprehensive metabolic panel  Arthritis  Glaucoma of both eyes, unspecified glaucoma type  History of left cataract surgery Recommend he cut back on his Prilosec to on an as-needed basis.  Continue on the HCTZ.  Explained we need to  follow-up on the aneurysm to assess whether it is growing.  He will continue to be followed by ophthalmology.  Discussed his smoking but at 3 cigarettes/day and not can say too much.  Continue on Pravachol. Continue with OTC meds for his allergies.    Immunization recommendations discussed.  Colonoscopy recommendations reviewed.   Medicare Attestation I have personally reviewed: The patient's medical and social history Their use of alcohol, tobacco or illicit drugs Their current medications and supplements The patient's functional ability including ADLs,fall risks, home safety risks, cognitive, and hearing and visual impairment Diet and physical activities Evidence for depression or mood disorders  The patient's weight, height, and BMI have been recorded in the chart.  I have made referrals, counseling, and provided education to the patient based on review of the above and I have provided the patient with a written personalized care plan for preventive services.     Jill Alexanders, MD   02/12/2021

## 2021-02-13 DIAGNOSIS — H2012 Chronic iridocyclitis, left eye: Secondary | ICD-10-CM | POA: Diagnosis not present

## 2021-02-13 LAB — CBC WITH DIFFERENTIAL/PLATELET
Basophils Absolute: 0.1 10*3/uL (ref 0.0–0.2)
Basos: 1 %
EOS (ABSOLUTE): 0.3 10*3/uL (ref 0.0–0.4)
Eos: 5 %
Hematocrit: 44.5 % (ref 37.5–51.0)
Hemoglobin: 15.1 g/dL (ref 13.0–17.7)
Immature Grans (Abs): 0 10*3/uL (ref 0.0–0.1)
Immature Granulocytes: 0 %
Lymphocytes Absolute: 3.4 10*3/uL — ABNORMAL HIGH (ref 0.7–3.1)
Lymphs: 49 %
MCH: 28.8 pg (ref 26.6–33.0)
MCHC: 33.9 g/dL (ref 31.5–35.7)
MCV: 85 fL (ref 79–97)
Monocytes Absolute: 0.4 10*3/uL (ref 0.1–0.9)
Monocytes: 6 %
Neutrophils Absolute: 2.7 10*3/uL (ref 1.4–7.0)
Neutrophils: 39 %
Platelets: 257 10*3/uL (ref 150–450)
RBC: 5.25 x10E6/uL (ref 4.14–5.80)
RDW: 14.4 % (ref 11.6–15.4)
WBC: 6.9 10*3/uL (ref 3.4–10.8)

## 2021-02-13 LAB — LIPID PANEL
Chol/HDL Ratio: 3.7 ratio (ref 0.0–5.0)
Cholesterol, Total: 231 mg/dL — ABNORMAL HIGH (ref 100–199)
HDL: 62 mg/dL (ref >39)
LDL Chol Calc (NIH): 156 mg/dL — ABNORMAL HIGH (ref 0–99)
Triglycerides: 77 mg/dL (ref 0–149)
VLDL Cholesterol Cal: 13 mg/dL (ref 5–40)

## 2021-02-13 LAB — COMPREHENSIVE METABOLIC PANEL
ALT: 10 IU/L (ref 0–44)
AST: 18 IU/L (ref 0–40)
Albumin/Globulin Ratio: 1.2 (ref 1.2–2.2)
Albumin: 4.2 g/dL (ref 3.6–4.6)
Alkaline Phosphatase: 94 IU/L (ref 44–121)
BUN/Creatinine Ratio: 12 (ref 10–24)
BUN: 18 mg/dL (ref 8–27)
Bilirubin Total: 0.3 mg/dL (ref 0.0–1.2)
CO2: 22 mmol/L (ref 20–29)
Calcium: 9.9 mg/dL (ref 8.6–10.2)
Chloride: 102 mmol/L (ref 96–106)
Creatinine, Ser: 1.53 mg/dL — ABNORMAL HIGH (ref 0.76–1.27)
Globulin, Total: 3.4 g/dL (ref 1.5–4.5)
Glucose: 91 mg/dL (ref 65–99)
Potassium: 4.7 mmol/L (ref 3.5–5.2)
Sodium: 141 mmol/L (ref 134–144)
Total Protein: 7.6 g/dL (ref 6.0–8.5)
eGFR: 45 mL/min/{1.73_m2} — ABNORMAL LOW (ref >59)

## 2021-02-19 ENCOUNTER — Encounter: Payer: Self-pay | Admitting: Family Medicine

## 2021-03-13 DIAGNOSIS — H2012 Chronic iridocyclitis, left eye: Secondary | ICD-10-CM | POA: Diagnosis not present

## 2021-03-24 ENCOUNTER — Other Ambulatory Visit: Payer: Self-pay | Admitting: Family Medicine

## 2021-03-24 DIAGNOSIS — K219 Gastro-esophageal reflux disease without esophagitis: Secondary | ICD-10-CM

## 2021-03-27 DIAGNOSIS — H2012 Chronic iridocyclitis, left eye: Secondary | ICD-10-CM | POA: Diagnosis not present

## 2021-04-17 DIAGNOSIS — H2012 Chronic iridocyclitis, left eye: Secondary | ICD-10-CM | POA: Diagnosis not present

## 2021-06-17 ENCOUNTER — Other Ambulatory Visit: Payer: Self-pay | Admitting: Family Medicine

## 2021-06-17 NOTE — Telephone Encounter (Signed)
Is this okay to refill? 

## 2021-06-18 ENCOUNTER — Telehealth: Payer: Self-pay | Admitting: Family Medicine

## 2021-06-18 MED ORDER — MELOXICAM 7.5 MG PO TABS: 7.5000 mg | ORAL_TABLET | Freq: Every day | ORAL | 1 refills | Status: DC

## 2021-06-18 NOTE — Telephone Encounter (Signed)
Pt stopped by and states that he needs a refill on meloxicam 7.5 mg. He states that Dr. Manuella Ghazi normally fills this but would like you to fill from this point. Pt uses [facility] on [address]  and can be reached [phone].

## 2021-06-22 ENCOUNTER — Other Ambulatory Visit: Payer: Self-pay | Admitting: Family Medicine

## 2021-06-22 DIAGNOSIS — K219 Gastro-esophageal reflux disease without esophagitis: Secondary | ICD-10-CM

## 2021-08-14 DIAGNOSIS — H25811 Combined forms of age-related cataract, right eye: Secondary | ICD-10-CM | POA: Diagnosis not present

## 2021-08-14 DIAGNOSIS — H40052 Ocular hypertension, left eye: Secondary | ICD-10-CM | POA: Diagnosis not present

## 2021-08-14 DIAGNOSIS — H2012 Chronic iridocyclitis, left eye: Secondary | ICD-10-CM | POA: Diagnosis not present

## 2021-08-14 DIAGNOSIS — H04123 Dry eye syndrome of bilateral lacrimal glands: Secondary | ICD-10-CM | POA: Diagnosis not present

## 2021-08-25 ENCOUNTER — Other Ambulatory Visit (INDEPENDENT_AMBULATORY_CARE_PROVIDER_SITE_OTHER): Payer: Medicare HMO

## 2021-08-25 ENCOUNTER — Other Ambulatory Visit: Payer: Self-pay

## 2021-08-25 DIAGNOSIS — Z23 Encounter for immunization: Secondary | ICD-10-CM | POA: Diagnosis not present

## 2021-09-03 DIAGNOSIS — H25811 Combined forms of age-related cataract, right eye: Secondary | ICD-10-CM | POA: Diagnosis not present

## 2021-09-03 DIAGNOSIS — Z01818 Encounter for other preprocedural examination: Secondary | ICD-10-CM | POA: Diagnosis not present

## 2021-09-20 ENCOUNTER — Other Ambulatory Visit: Payer: Self-pay | Admitting: Family Medicine

## 2021-09-20 DIAGNOSIS — K219 Gastro-esophageal reflux disease without esophagitis: Secondary | ICD-10-CM

## 2021-10-02 DIAGNOSIS — H25811 Combined forms of age-related cataract, right eye: Secondary | ICD-10-CM | POA: Diagnosis not present

## 2021-10-02 DIAGNOSIS — H2511 Age-related nuclear cataract, right eye: Secondary | ICD-10-CM | POA: Diagnosis not present

## 2021-10-02 DIAGNOSIS — H25812 Combined forms of age-related cataract, left eye: Secondary | ICD-10-CM | POA: Diagnosis not present

## 2021-10-27 ENCOUNTER — Other Ambulatory Visit: Payer: Self-pay | Admitting: Family Medicine

## 2021-10-28 ENCOUNTER — Other Ambulatory Visit: Payer: Self-pay | Admitting: Family Medicine

## 2021-10-28 NOTE — Telephone Encounter (Signed)
[facility] is requesting to fill pt mobic. Please advise KH 

## 2021-12-19 ENCOUNTER — Other Ambulatory Visit: Payer: Self-pay | Admitting: Family Medicine

## 2021-12-19 DIAGNOSIS — K219 Gastro-esophageal reflux disease without esophagitis: Secondary | ICD-10-CM

## 2022-01-06 ENCOUNTER — Other Ambulatory Visit: Payer: Self-pay | Admitting: Family Medicine

## 2022-01-06 NOTE — Telephone Encounter (Signed)
[facility] is requesting to fill pt mobic. Please advise KH 

## 2022-02-17 ENCOUNTER — Other Ambulatory Visit: Payer: Self-pay

## 2022-02-17 ENCOUNTER — Ambulatory Visit (INDEPENDENT_AMBULATORY_CARE_PROVIDER_SITE_OTHER): Payer: Medicare PPO | Admitting: Family Medicine

## 2022-02-17 ENCOUNTER — Encounter: Payer: Self-pay | Admitting: Family Medicine

## 2022-02-17 VITALS — BP 124/78 | HR 73 | Temp 97.5°F | Ht 70.5 in | Wt 198.6 lb

## 2022-02-17 DIAGNOSIS — I10 Essential (primary) hypertension: Secondary | ICD-10-CM | POA: Diagnosis not present

## 2022-02-17 DIAGNOSIS — F17200 Nicotine dependence, unspecified, uncomplicated: Secondary | ICD-10-CM | POA: Diagnosis not present

## 2022-02-17 DIAGNOSIS — M1990 Unspecified osteoarthritis, unspecified site: Secondary | ICD-10-CM

## 2022-02-17 DIAGNOSIS — N289 Disorder of kidney and ureter, unspecified: Secondary | ICD-10-CM

## 2022-02-17 DIAGNOSIS — K219 Gastro-esophageal reflux disease without esophagitis: Secondary | ICD-10-CM | POA: Diagnosis not present

## 2022-02-17 DIAGNOSIS — I714 Abdominal aortic aneurysm, without rupture, unspecified: Secondary | ICD-10-CM | POA: Diagnosis not present

## 2022-02-17 DIAGNOSIS — Z0000 Encounter for general adult medical examination without abnormal findings: Secondary | ICD-10-CM | POA: Diagnosis not present

## 2022-02-17 DIAGNOSIS — K769 Liver disease, unspecified: Secondary | ICD-10-CM

## 2022-02-17 DIAGNOSIS — R7301 Impaired fasting glucose: Secondary | ICD-10-CM | POA: Diagnosis not present

## 2022-02-17 DIAGNOSIS — E785 Hyperlipidemia, unspecified: Secondary | ICD-10-CM

## 2022-02-17 DIAGNOSIS — J301 Allergic rhinitis due to pollen: Secondary | ICD-10-CM | POA: Diagnosis not present

## 2022-02-17 LAB — CBC WITH DIFFERENTIAL/PLATELET
Basophils Absolute: 0.1 10*3/uL (ref 0.0–0.2)
Basos: 1 %
EOS (ABSOLUTE): 0.4 10*3/uL (ref 0.0–0.4)
Eos: 6 %
Hematocrit: 45 % (ref 37.5–51.0)
Hemoglobin: 14.8 g/dL (ref 13.0–17.7)
Immature Grans (Abs): 0 10*3/uL (ref 0.0–0.1)
Immature Granulocytes: 0 %
Lymphocytes Absolute: 3.5 10*3/uL — ABNORMAL HIGH (ref 0.7–3.1)
Lymphs: 46 %
MCH: 27.9 pg (ref 26.6–33.0)
MCHC: 32.9 g/dL (ref 31.5–35.7)
MCV: 85 fL (ref 79–97)
Monocytes Absolute: 0.5 10*3/uL (ref 0.1–0.9)
Monocytes: 6 %
Neutrophils Absolute: 3.1 10*3/uL (ref 1.4–7.0)
Neutrophils: 41 %
Platelets: 270 10*3/uL (ref 150–450)
RBC: 5.3 x10E6/uL (ref 4.14–5.80)
RDW: 14.3 % (ref 11.6–15.4)
WBC: 7.5 10*3/uL (ref 3.4–10.8)

## 2022-02-17 LAB — COMPREHENSIVE METABOLIC PANEL
ALT: 9 IU/L (ref 0–44)
AST: 20 IU/L (ref 0–40)
Albumin/Globulin Ratio: 1.3 (ref 1.2–2.2)
Albumin: 4.2 g/dL (ref 3.6–4.6)
Alkaline Phosphatase: 98 IU/L (ref 44–121)
BUN/Creatinine Ratio: 12 (ref 10–24)
BUN: 18 mg/dL (ref 8–27)
Bilirubin Total: 0.2 mg/dL (ref 0.0–1.2)
CO2: 23 mmol/L (ref 20–29)
Calcium: 9.9 mg/dL (ref 8.6–10.2)
Chloride: 106 mmol/L (ref 96–106)
Creatinine, Ser: 1.45 mg/dL — ABNORMAL HIGH (ref 0.76–1.27)
Globulin, Total: 3.3 g/dL (ref 1.5–4.5)
Glucose: 112 mg/dL — ABNORMAL HIGH (ref 70–99)
Potassium: 4.4 mmol/L (ref 3.5–5.2)
Sodium: 142 mmol/L (ref 134–144)
Total Protein: 7.5 g/dL (ref 6.0–8.5)
eGFR: 48 mL/min/{1.73_m2} — ABNORMAL LOW (ref >59)

## 2022-02-17 LAB — LIPID PANEL
Chol/HDL Ratio: 4.5 ratio (ref 0.0–5.0)
Cholesterol, Total: 241 mg/dL — ABNORMAL HIGH (ref 100–199)
HDL: 53 mg/dL (ref >39)
LDL Chol Calc (NIH): 168 mg/dL — ABNORMAL HIGH (ref 0–99)
Triglycerides: 110 mg/dL (ref 0–149)
VLDL Cholesterol Cal: 20 mg/dL (ref 5–40)

## 2022-02-17 MED ORDER — HYDROCHLOROTHIAZIDE 12.5 MG PO CAPS: ORAL_CAPSULE | ORAL | 3 refills | Status: DC

## 2022-02-17 MED ORDER — OMEPRAZOLE 40 MG PO CPDR: DELAYED_RELEASE_CAPSULE | ORAL | 3 refills | Status: DC

## 2022-02-17 MED ORDER — PRAVASTATIN SODIUM 20 MG PO TABS: 20.0000 mg | ORAL_TABLET | Freq: Every day | ORAL | 3 refills | Status: DC

## 2022-02-17 MED ORDER — MELOXICAM 7.5 MG PO TABS: 7.5000 mg | ORAL_TABLET | Freq: Every day | ORAL | 5 refills | Status: DC

## 2022-02-17 NOTE — Progress Notes (Signed)
? ?  Subjective:  ? ? Patient ID: Joseph West, male    DOB: 1941-09-03, 81 y.o.   MRN: [id number] ? ?HPI ?He is here for complete examination.  He has had some difficulty recently with dental problems interfering with his eating habits causing some constipation.  He is a veteran does go to the [place] but gets most of his care here.  Does have difficulty with arthritis however meloxicam seems to work very well for him.  He continues on Prilosec to help with his reflux symptoms.  Continues on HCTZ for his blood pressure.  He does exercise regularly.  He still smokes roughly 5 cigarettes/week.  He has a previous history of AAA and does need a follow-up ultrasound.  Also CT scan did show questionable lesion in the left kidney that needs a follow-up MRI.  Otherwise his family and social history as well as health maintenance and immunizations was reviewed. ? ? ?Review of Systems  ?All other systems reviewed and are negative. ? ?   ?Objective:  ? Physical Exam ?Alert and in no distress. Tympanic membranes and canals are normal. Pharyngeal area is normal. Neck is supple without adenopathy or thyromegaly. Cardiac exam shows a regular sinus rhythm without murmurs or gallops. Lungs are clear to auscultation. ? ? ?MMSE 24 ? ?   ?Assessment & Plan:  ?Routine general medical examination at a health care facility - Plan: CBC with Differential/Platelet, Comprehensive metabolic panel, Lipid panel ? ?Essential hypertension - Plan: CBC with Differential/Platelet, Comprehensive metabolic panel, hydrochlorothiazide (MICROZIDE) 12.5 MG capsule ? ?Gastroesophageal reflux disease without esophagitis - Plan: omeprazole (PRILOSEC) 40 MG capsule ? ?Abdominal aortic aneurysm (AAA) without rupture, unspecified part - Plan: US Abdomen Limited ? ?Arthritis - Plan: meloxicam (MOBIC) 7.5 MG tablet ? ?Hyperlipidemia, unspecified hyperlipidemia type - Plan: Lipid panel, pravastatin (PRAVACHOL) 20 MG tablet ? ?Seasonal allergic rhinitis due to  pollen ? ?Smoker ? ?Kidney lesion, native, left - Plan: MR Abdomen W Wo Contrast ?Did recommend that he cut back on his Prilosec did not still try to get good control.  Recommend getting Shingrix at the drugstore.  Again encouraged him to quit smoking entirely.  Did recommend MiraLAX to help with his constipation until he can get dental work done. ?With the MMSE being borderline, we will continue to monitor this. ?

## 2022-02-17 NOTE — Patient Instructions (Signed)
Until you get your teeth fixed use MiraLAX regularly ?See if you can take the Prilosec every other day to still control your symptoms and if he can then see if you can take it every 3 days ?

## 2022-02-17 NOTE — Progress Notes (Deleted)
Joseph West is a 81 y.o. male who presents for annual wellness visit and follow-up on chronic medical conditions.  He has the following concerns: ? ? ?Immunizations and Health Maintenance ?Immunization History  ?Administered Date(s) Administered  ? Fluad Quad(high Dose 65+) 09/04/2019, 10/06/2020, 08/25/2021  ? Influenza, High Dose Seasonal PF 09/04/2013, 08/28/2015, 10/08/2016, 10/05/2017  ? Influenza-Unspecified 08/08/2018  ? Moderna Covid-19 Vaccine Bivalent Booster 53yr & up 08/25/2021  ? Moderna Sars-Covid-2 Vaccination 12/14/2019, 01/18/2020, 09/18/2020  ? Pneumococcal Conjugate-13 08/28/2015  ? Pneumococcal Polysaccharide-23 03/25/2006  ? Td 11/22/2000  ? Tdap 01/30/2013  ? Zoster, Live 05/10/2006  ? ?There are no preventive care reminders to display for this patient. ? ?Last colonoscopy: ?Last PSA: ?Dentist: ?Ophtho: ?Exercise: ? ?Other doctors caring for patient include: ? ?Advanced Directives: ?  ? ?Depression screen:  See questionnaire below.   ?  ? ?  02/12/2021  ?  9:46 AM 02/12/2020  ?  1:43 PM 12/20/2017  ? 10:29 AM 10/08/2016  ?  9:59 AM 08/28/2015  ?  2:07 PM  ?Depression screen PHQ 2/9  ?Decreased Interest 0 0 0 0 0  ?Down, Depressed, Hopeless 0 0 0 0 0  ?PHQ - 2 Score 0 0 0 0 0  ? ? ?Fall Screen: See Questionaire below. ?  ? ?  02/12/2021  ?  9:45 AM 02/12/2020  ?  1:43 PM 12/20/2017  ? 10:29 AM 10/08/2016  ?  9:59 AM 08/28/2015  ?  2:07 PM  ?Fall Risk   ?Falls in the past year? 0 0 No No No  ?Number falls in past yr: 0 0     ?Injury with Fall? 0 0     ?Follow up Falls evaluation completed      ? ? ?ADL screen:  See questionnaire below.  ?Functional Status Survey: ?  ? ? ?Review of Systems ? ?Constitutional: -, -unexpected weight change, -anorexia, -fatigue ?Allergy: -sneezing, -itching, -congestion ?Dermatology: denies changing moles, rash, lumps ?ENT: -runny nose, -ear pain, -sore throat,  ?Cardiology:  -chest pain, -palpitations, -orthopnea, ?Respiratory: -cough, -shortness of breath, -dyspnea on  exertion, -wheezing,  ?Gastroenterology: -abdominal pain, -nausea, -vomiting, -diarrhea, -constipation, -dysphagia ?Hematology: -bleeding or bruising problems ?Musculoskeletal: -arthralgias, -myalgias, -joint swelling, -back pain, - ?Ophthalmology: -vision changes,  ?Urology: -dysuria, -difficulty urinating,  -urinary frequency, -urgency, incontinence ?Neurology: -, -numbness, , -memory loss, -falls, -dizziness ? ? ? ?PHYSICAL EXAM: ? ?There were no vitals taken for this visit. ? ?General Appearance: Alert, cooperative, no distress, appears stated age ?Head: Normocephalic, without obvious abnormality, atraumatic ?Eyes: PERRL, conjunctiva/corneas clear, EOM's intact, fundi benign ?Ears: Normal TM's and external ear canals ?Nose: Nares normal, mucosa normal, no drainage or sinus   tenderness ?Throat: Lips, mucosa, and tongue normal; teeth and gums normal ?Neck: Supple, no lymphadenopathy, thyroid:no enlargement/tenderness/nodules; no carotid bruit or JVD ?Lungs: Clear to auscultation bilaterally without wheezes, rales or ronchi; respirations unlabored ?Heart: Regular rate and rhythm, S1 and S2 normal, no murmur, rub or gallop ?Abdomen: Soft, non-tender, nondistended, normoactive bowel sounds, no masses, no hepatosplenomegaly ?Extremities: No clubbing, cyanosis or edema ?Pulses: 2+ and symmetric all extremities ?Skin: Skin color, texture, turgor normal, no rashes or lesions ?Lymph nodes: Cervical, supraclavicular, and axillary nodes normal ?Neurologic: CNII-XII intact, normal strength, sensation and gait; reflexes 2+ and symmetric throughout   ?Psych: Normal mood, affect, hygiene and grooming ? ?ASSESSMENT/PLAN: ? ? ? ?Discussed PSA screening (risks/benefits), recommended at least 30 minutes of aerobic activity at least 5 days/week; proper sunscreen use reviewed; healthy diet and alcohol recommendations (  less than or equal to 2 drinks/day) reviewed; regular seatbelt use; changing batteries in smoke detectors.  Immunization recommendations discussed.  Colonoscopy recommendations reviewed. ? ? ?Medicare Attestation ?I have personally reviewed: ?The patient's medical and social history ?Their use of alcohol, tobacco or illicit drugs ?Their current medications and supplements ?The patient's functional ability including ADLs,fall risks, home safety risks, cognitive, and hearing and visual impairment ?Diet and physical activities ?Evidence for depression or mood disorders ? ?The patient's weight, height, and BMI have been recorded in the chart.  I have made referrals, counseling, and provided education to the patient based on review of the above and I have provided the patient with a written personalized care plan for preventive services.   ? ? ?Jill Alexanders, MD   02/17/2022  ? ? ? ?

## 2022-02-18 MED ORDER — ATORVASTATIN CALCIUM 20 MG PO TABS: 20.0000 mg | ORAL_TABLET | Freq: Every day | ORAL | 4 refills | Status: DC

## 2022-02-18 NOTE — Addendum Note (Signed)
Addended by: Denita Lung on: 02/18/2022 09:53 AM ? ? Modules accepted: Orders ? ?

## 2022-02-19 ENCOUNTER — Telehealth: Payer: Self-pay

## 2022-02-19 LAB — HGB A1C W/O EAG: Hgb A1c MFr Bld: 6 % — ABNORMAL HIGH (ref 4.8–5.6)

## 2022-02-19 LAB — SPECIMEN STATUS REPORT

## 2022-02-19 NOTE — Telephone Encounter (Signed)
A1c added to pt labs per [place]. [place] ?

## 2022-02-26 ENCOUNTER — Ambulatory Visit
Admission: RE | Admit: 2022-02-26 | Discharge: 2022-02-26 | Disposition: A | Discharge status: Home / Self Care | Payer: Medicare PPO | Source: Ambulatory Visit | Attending: Family Medicine | Admitting: Family Medicine

## 2022-02-26 DIAGNOSIS — I714 Abdominal aortic aneurysm, without rupture, unspecified: Secondary | ICD-10-CM | POA: Diagnosis not present

## 2022-03-10 ENCOUNTER — Encounter: Payer: Self-pay | Admitting: Family Medicine

## 2022-03-10 ENCOUNTER — Ambulatory Visit (INDEPENDENT_AMBULATORY_CARE_PROVIDER_SITE_OTHER): Payer: Medicare PPO | Admitting: Family Medicine

## 2022-03-10 VITALS — BP 130/80 | HR 64 | Temp 98.6°F | Wt 202.2 lb

## 2022-03-10 DIAGNOSIS — R151 Fecal smearing: Secondary | ICD-10-CM

## 2022-03-10 LAB — HEMOCCULT GUIAC POC 1CARD (OFFICE): Fecal Occult Blood, POC: NEGATIVE

## 2022-03-10 NOTE — Progress Notes (Signed)
? ?  Subjective:  ? ? Patient ID: Joseph West, male    DOB: [birth year], 81 y.o.   MRN: [id number] ? ?HPI ?He is here for follow-up after last being seen.  He started using MiraLAX on a regular schedule which did help with his BMs but they started to come out more loose and he also has some difficulty with burning sensation and stool leakage because of this. ? ? ?Review of Systems ? ?   ?Objective:  ? Physical Exam ?After the anal area was cleaned of stool, no visible lesions were seen.  Digital rectal exam showed normal sphincter tone and no masses were appreciated.  The stool was guaiac negative. ? ? ? ?   ?Assessment & Plan:  ?Soiling - Plan: POCT occult blood stool ?He is to use half dosing of the MiraLAX for the next several weeks and call me if he still having difficulty for referral to gastroenterology.  He was comfortable with that. ? ?

## 2022-03-12 ENCOUNTER — Ambulatory Visit
Admission: RE | Admit: 2022-03-12 | Discharge: 2022-03-12 | Disposition: A | Discharge status: Home / Self Care | Payer: Medicare PPO | Source: Ambulatory Visit | Attending: Family Medicine | Admitting: Family Medicine

## 2022-03-12 DIAGNOSIS — N2889 Other specified disorders of kidney and ureter: Secondary | ICD-10-CM | POA: Diagnosis not present

## 2022-03-12 MED ORDER — GADOBENATE DIMEGLUMINE 529 MG/ML IV SOLN
19.0000 mL | Freq: Once | INTRAVENOUS | Status: AC | PRN
  Administered 2022-03-12: 19 mL via INTRAVENOUS

## 2022-03-14 NOTE — Addendum Note (Signed)
Addended by: Denita Lung on: 03/14/2022 08:39 PM ? ? Modules accepted: Orders ? ?

## 2022-03-17 ENCOUNTER — Telehealth: Payer: Self-pay | Admitting: Family Medicine

## 2022-03-17 MED ORDER — ALPRAZOLAM 0.25 MG PO TABS: 0.2500 mg | ORAL_TABLET | Freq: Two times a day (BID) | ORAL | 0 refills | Status: DC | PRN

## 2022-03-17 NOTE — Telephone Encounter (Signed)
Pt stopped by and states that he is having some teeth pulled tomorrow at his dentist, He is very nervous about it. Pt would like something for his nerves. Pt can be reached at [phone] and uses [facility] on [address].  ?

## 2022-03-31 ENCOUNTER — Other Ambulatory Visit: Payer: BC Managed Care – PPO

## 2022-07-16 ENCOUNTER — Ambulatory Visit (INDEPENDENT_AMBULATORY_CARE_PROVIDER_SITE_OTHER): Payer: Medicare HMO | Admitting: Family Medicine

## 2022-07-16 VITALS — Temp 97.5°F | Wt 180.6 lb

## 2022-07-16 DIAGNOSIS — I951 Orthostatic hypotension: Secondary | ICD-10-CM

## 2022-07-16 NOTE — Progress Notes (Signed)
   Subjective:    Patient ID: Joseph West, male    DOB: [birth year], 81 y.o.   MRN: [id number]  HPI He is here for consult concerning dizziness.  He states that if he goes from sitting to standing too quickly or from lying to standing too quickly he will get dizzy but just for a few seconds and then returned to normal.  His daily activities do not cause any difficulty.  He swims regularly and plays golf.   Review of Systems     Objective:   Physical Exam Alert and in no distress.  Blood pressure and pulse lying and standing were recorded.  Cardiac exam shows regular rhythm without murmurs or gallops.       Assessment & Plan:  Postural hypotension I explained that this is not unusual and strongly encouraged him to move from 1 position to another a little more slowly and also to keep himself well-hydrated especially during the summer months when it is very very warm and also hydration can play a role.

## 2022-07-28 ENCOUNTER — Encounter: Payer: Self-pay | Admitting: Internal Medicine

## 2022-07-29 ENCOUNTER — Telehealth: Payer: Self-pay | Admitting: Family Medicine

## 2022-07-29 NOTE — Telephone Encounter (Signed)
Left message for patient to call back and schedule Medicare Annual Wellness Visit (AWV) either virtually or in office. I left my number for patient to call [phone].  Last AWV 02/12/21 ; please schedule at anytime with health coach

## 2022-08-27 ENCOUNTER — Ambulatory Visit (INDEPENDENT_AMBULATORY_CARE_PROVIDER_SITE_OTHER): Payer: Medicare HMO

## 2022-08-27 VITALS — BP 138/80 | HR 72 | Temp 98.4°F | Ht 71.5 in | Wt 184.4 lb

## 2022-08-27 DIAGNOSIS — Z0000 Encounter for general adult medical examination without abnormal findings: Secondary | ICD-10-CM | POA: Diagnosis not present

## 2022-08-27 DIAGNOSIS — Z23 Encounter for immunization: Secondary | ICD-10-CM

## 2022-08-27 NOTE — Progress Notes (Signed)
Subjective:   Joseph West is a 81 y.o. male who presents for Medicare Annual/Subsequent preventive examination.  Review of Systems     Cardiac Risk Factors include: advanced age (>76mn, >>46women);dyslipidemia;male gender     Objective:    Today's Vitals   08/27/22 1416  BP: 138/80  Pulse: 72  Temp: 98.4 F (36.9 C)  TempSrc: Oral  SpO2: 99%  Weight: 184 lb 6.4 oz (83.6 kg)  Height: 5' 11.5" (1.816 m)   Body mass index is 25.36 kg/m.     08/27/2022    2:26 PM 02/17/2022    9:29 AM 02/12/2021    9:45 AM 02/12/2020    2:13 PM 12/20/2017   10:53 AM 08/28/2015    2:06 PM  Advanced Directives  Does Patient Have a Medical Advance Directive? Yes Yes Yes Yes Yes No  Type of AParamedicof [place]ake [place]Living will Living will Living will;Healthcare Power of [place]edford HeightsLiving will Living will   Does patient want to make changes to medical advance directive?  No - Patient declined No - Patient declined No - Patient declined No - Patient declined   Copy of [place]? Yes - validated most recent copy scanned in chart (See row information)  Yes - validated most recent copy scanned in chart (See row information) No - copy requested    Would patient like information on creating a medical advance directive?      Yes - Educational materials given    Current Medications (verified) Outpatient Encounter Medications as of 08/27/2022  Medication Sig   atorvastatin (LIPITOR) 20 MG tablet Take 1 tablet (20 mg total) by mouth daily.   hydrochlorothiazide (MICROZIDE) 12.5 MG capsule TAKE 1 CAPSULE(12.5 MG) BY MOUTH DAILY   meloxicam (MOBIC) 7.5 MG tablet Take 1 tablet (7.5 mg total) by mouth daily.   Multiple Vitamin (MULTIVITAMIN) capsule Take 1 capsule by mouth daily.   omeprazole (PRILOSEC) 40 MG capsule TAKE 1 CAPSULE(40 MG) BY MOUTH DAILY   pravastatin (PRAVACHOL) 20 MG tablet Take 20 mg by mouth daily.   ALPRAZolam  (XANAX) 0.25 MG tablet Take 1 tablet (0.25 mg total) by mouth 2 (two) times daily as needed for anxiety. (Patient not taking: Reported on 07/16/2022)   Dextromethorphan HBr (VICKS DAYQUIL COUGH) 15 MG/15ML LIQD Take 30 mLs by mouth daily. (Patient not taking: Reported on 02/17/2022)   ofloxacin (OCUFLOX) 0.3 % ophthalmic solution  (Patient not taking: Reported on 02/17/2022)   No facility-administered encounter medications on file as of 08/27/2022.    Allergies (verified) Patient has no known allergies.   History: Past Medical History:  Diagnosis Date   Allergic rhinitis    Allergy    Arthritis    Cataract    GERD (gastroesophageal reflux disease) 01-10-2007   EGD   Hiatal hernia 01-10-2007   EGD   Internal hemorrhoids without mention of complication [id number]  Colonoscopy   Schatzki's ring    Sickle cell trait (HCC)    Stricture and stenosis of esophagus 01-10-2007   EGD    Past Surgical History:  Procedure Laterality Date   COLONOSCOPY  2008   Dr. pSharlett Iles  Family History  Problem Relation Age of Onset   Coronary artery disease Mother        died in late 610s  Other Father        died age 7312s unknown cause   Diabetes Sister    Other Brother  unknown cause of death   Alcohol abuse Sister    Heart disease Sister        stents   Heart disease Sister        stents   Colon cancer Neg Hx    Esophageal cancer Neg Hx    Rectal cancer Neg Hx    Stomach cancer Neg Hx    Social History   Socioeconomic History   Marital status: Married    Spouse name: Not on file   Number of children: 3   Years of education: Not on file   Highest education level: Not on file  Occupational History   Occupation: retired Banker: [employer]  Tobacco Use   Smoking status: Some Days    Packs/day: 0.02    Types: Cigarettes   Smokeless tobacco: Never  Vaping Use   Vaping Use: Never used  Substance and Sexual Activity   Alcohol use: No    Alcohol/week: 0.0  standard drinks of alcohol   Drug use: No   Sexual activity: Yes    Partners: Male  Other Topics Concern   Not on file  Social History Narrative   Not on file   Social Determinants of Health   Financial Resource Strain: Low Risk  (08/27/2022)   Overall Financial Resource Strain (CARDIA)    Difficulty of Paying Living Expenses: Not hard at all  Food Insecurity: No Food Insecurity (08/27/2022)   Hunger Vital Sign    Worried About Running Out of Food in the Last Year: Never true    Ran Out of Food in the Last Year: Never true  Transportation Needs: No Transportation Needs (08/27/2022)   PRAPARE - Hydrologist (Medical): No    Lack of Transportation (Non-Medical): No  Physical Activity: Sufficiently Active (08/27/2022)   Exercise Vital Sign    Days of Exercise per Week: 4 days    Minutes of Exercise per Session: 60 min  Stress: No Stress Concern Present (08/27/2022)   [place]    Feeling of Stress : Not at all  Social Connections: Not on file    Tobacco Counseling Ready to quit: Yes Counseling given: Not Answered   Clinical Intake:  Pre-visit preparation completed: Yes  Pain : No/denies pain     Nutritional Status: BMI 25 -29 Overweight Nutritional Risks: None Diabetes: No  How often do you need to have someone help you when you read instructions, pamphlets, or other written materials from your doctor or pharmacy?: 1 - Never What is the last grade level you completed in school?: 12th grade  Diabetic? no  Interpreter Needed?: No  Information entered by :: NAllen LPN   Activities of Daily Living    08/27/2022    2:28 PM 02/17/2022    9:31 AM  In your present state of health, do you have any difficulty performing the following activities:  Hearing? 0 0  Vision? 0 0  Difficulty concentrating or making decisions? 0 0  Walking or climbing stairs? 0 0  Dressing or  bathing? 0 0  Doing errands, shopping? 0 0  Preparing Food and eating ? N N  Using the Toilet? N N  In the past six months, have you accidently leaked urine? N N  Do you have problems with loss of bowel control? N N  Managing your Medications? N N  Managing your Finances? N N  Housekeeping or  managing your Housekeeping? N N    Patient Care Team: Denita Lung, MD as PCP - General (Family Medicine)  Indicate any recent Medical Services you may have received from other than [facility] providers in the past year (date may be approximate).     Assessment:   This is a routine wellness examination for [place].  Hearing/Vision screen Vision Screening - Comments:: No regular eye exams,  Dietary issues and exercise activities discussed: Current Exercise Habits: Home exercise routine, Type of exercise: walking, Time (Minutes): 60, Frequency (Times/Week): 4, Weekly Exercise (Minutes/Week): 240   Goals Addressed             This Visit's Progress    Patient Stated       08/27/2022, stay healthy       Depression Screen    08/27/2022    2:27 PM 02/17/2022    9:31 AM 02/12/2021    9:46 AM 02/12/2020    1:43 PM 12/20/2017   10:29 AM 10/08/2016    9:59 AM 08/28/2015    2:07 PM  PHQ 2/9 Scores  PHQ - 2 Score 0 0 0 0 0 0 0  PHQ- 9 Score 0          Fall Risk    08/27/2022    2:26 PM 02/17/2022    9:30 AM 02/12/2021    9:45 AM 02/12/2020    1:43 PM 12/20/2017   10:29 AM  Fall Risk   Falls in the past year? 0 0 0 0 No  Number falls in past yr: 0 0 0 0   Injury with Fall? 0 0 0 0   Risk for fall due to : Medication side effect No Fall Risks     Follow up Falls prevention discussed;Education provided;Falls evaluation completed Falls evaluation completed Falls evaluation completed      FALL RISK PREVENTION PERTAINING TO THE HOME:  Any stairs in or around the home? No  If so, are there any without handrails? N/a Home free of loose throw rugs in walkways, pet beds, electrical cords, etc?  Yes  Adequate lighting in your home to reduce risk of falls? Yes   ASSISTIVE DEVICES UTILIZED TO PREVENT FALLS:  Life alert? No  Use of a cane, walker or w/c? No  Grab bars in the bathroom? Yes  Shower chair or bench in shower? Yes  Elevated toilet seat or a handicapped toilet? No   TIMED UP AND GO:  Was the test performed? Yes .  Length of time to ambulate 10 feet: 5 sec.   Gait steady and fast without use of assistive device  Cognitive Function:    02/17/2022   12:55 PM  MMSE - Mini Mental State Exam  Orientation to time 4  Orientation to Place 5  Registration 3  Attention/ Calculation 0  Recall 3  Language- name 2 objects 2  Language- repeat 1  Language- follow 3 step command 3  Language- read & follow direction 1  Write a sentence 1  Copy design 1  Total score 24        08/27/2022    2:30 PM 02/17/2022    9:33 AM  6CIT Screen  What Year? 0 points 0 points  What month? 0 points 0 points  What time? 0 points 0 points  Count back from 20 0 points 0 points  Months in reverse 4 points 4 points  Repeat phrase 0 points 10 points  Total Score 4 points 14 points  Immunizations Immunization History  Administered Date(s) Administered   Fluad Quad(high Dose 65+) 09/04/2019, 10/06/2020, 08/25/2021, 08/27/2022   Influenza, High Dose Seasonal PF 09/04/2013, 08/28/2015, 10/08/2016, 09/06/2017, 10/05/2017   Influenza-Unspecified 08/08/2018, 10/09/2019, 06/22/2021   Moderna Covid-19 Vaccine Bivalent Booster 28yr & up 08/25/2021   Moderna Sars-Covid-2 Vaccination 12/14/2019, 01/18/2020, 09/18/2020   Pneumococcal Conjugate-13 08/28/2015, 09/07/2017   Pneumococcal Polysaccharide-23 03/25/2006   Td 11/22/2000   Tdap 01/30/2013, 06/02/2018   Zoster Recombinat (Shingrix) 06/02/2018, 08/08/2018   Zoster, Live 05/10/2006    TDAP status: Up to date  Flu Vaccine status: Completed at today's visit  Pneumococcal vaccine status: Up to date  Covid-19 vaccine status:  Completed vaccines  Qualifies for Shingles Vaccine? Yes   Zostavax completed Yes   Shingrix Completed?: Yes  Screening Tests Health Maintenance  Topic Date Due   COVID-19 Vaccine (5 - Moderna risk series) 10/20/2021   TETANUS/TDAP  06/02/2028   Pneumonia Vaccine 82 Years old  Completed   INFLUENZA VACCINE  Completed   Zoster Vaccines- Shingrix  Completed   HPV VACCINES  Aged Out    Health Maintenance  Health Maintenance Due  Topic Date Due   COVID-19 Vaccine (5 - Moderna risk series) 10/20/2021    Colorectal cancer screening: No longer required.   Lung Cancer Screening: (Low Dose CT Chest recommended if Age 82-80years, 30 pack-year currently smoking OR have quit w/in 15years.) does not qualify.   Lung Cancer Screening Referral: no  Additional Screening:  Hepatitis C Screening: does not qualify;   Vision Screening: Recommended annual ophthalmology exams for early detection of glaucoma and other disorders of the eye. Is the patient up to date with their annual eye exam?  No  Who is the provider or what is the name of the office in which the patient attends annual eye exams? none If pt is not established with a provider, would they like to be referred to a provider to establish care? No .   Dental Screening: Recommended annual dental exams for proper oral hygiene  Community Resource Referral / Chronic Care Management: CRR required this visit?  No   CCM required this visit?  No      Plan:     I have personally reviewed and noted the following in the patient's chart:   Medical and social history Use of alcohol, tobacco or illicit drugs  Current medications and supplements including opioid prescriptions. Patient is not currently taking opioid prescriptions. Functional ability and status Nutritional status Physical activity Advanced directives List of other physicians Hospitalizations, surgeries, and ER visits in previous 12 months Vitals Screenings to  include cognitive, depression, and falls Referrals and appointments  In addition, I have reviewed and discussed with patient certain preventive protocols, quality metrics, and best practice recommendations. A written personalized care plan for preventive services as well as general preventive health recommendations were provided to patient.     NKellie Simmering LPN   [id number]  Nurse Notes: none

## 2022-08-27 NOTE — Patient Instructions (Signed)
[name] , Thank you for taking time to come for your Medicare Wellness Visit. I appreciate your ongoing commitment to your health goals. Please review the following plan we discussed and let me know if I can assist you in the future.   Screening recommendations/referrals: Colonoscopy: not required Recommended yearly ophthalmology/optometry visit for glaucoma screening and checkup Recommended yearly dental visit for hygiene and checkup  Vaccinations: Influenza vaccine: today Pneumococcal vaccine: completed 09/07/2017 Tdap vaccine: completed 06/02/2018, due 06/02/2028 Shingles vaccine: complete   Covid-19:  08/25/2021, 09/18/2020, 01/18/2020, 12/14/2019  Advanced directives: copy in chart  Conditions/risks identified: none  Next appointment: Follow up in one year for your annual wellness visit.   Preventive Care 82 Years and Older, Male Preventive care refers to lifestyle choices and visits with your health care provider that can promote health and wellness. What does preventive care include? A yearly physical exam. This is also called an annual well check. Dental exams once or twice a year. Routine eye exams. Ask your health care provider how often you should have your eyes checked. Personal lifestyle choices, including: Daily care of your teeth and gums. Regular physical activity. Eating a healthy diet. Avoiding tobacco and drug use. Limiting alcohol use. Practicing safe sex. Taking low doses of aspirin every day. Taking vitamin and mineral supplements as recommended by your health care provider. What happens during an annual well check? The services and screenings done by your health care provider during your annual well check will depend on your age, overall health, lifestyle risk factors, and family history of disease. Counseling  Your health care provider may ask you questions about your: Alcohol use. Tobacco use. Drug use. Emotional well-being. Home and relationship  well-being. Sexual activity. Eating habits. History of falls. Memory and ability to understand (cognition). Work and work Statistician. Screening  You may have the following tests or measurements: Height, weight, and BMI. Blood pressure. Lipid and cholesterol levels. These may be checked every 5 years, or more frequently if you are over 50 years old. Skin check. Lung cancer screening. You may have this screening every year starting at age 47 if you have a 30-pack-year history of smoking and currently smoke or have quit within the past 15 years. Fecal occult blood test (FOBT) of the stool. You may have this test every year starting at age 41. Flexible sigmoidoscopy or colonoscopy. You may have a sigmoidoscopy every 5 years or a colonoscopy every 10 years starting at age 54. Prostate cancer screening. Recommendations will vary depending on your family history and other risks. Hepatitis C blood test. Hepatitis B blood test. Sexually transmitted disease (STD) testing. Diabetes screening. This is done by checking your blood sugar (glucose) after you have not eaten for a while (fasting). You may have this done every 1-3 years. Abdominal aortic aneurysm (AAA) screening. You may need this if you are a current or former smoker. Osteoporosis. You may be screened starting at age 69 if you are at high risk. Talk with your health care provider about your test results, treatment options, and if necessary, the need for more tests. Vaccines  Your health care provider may recommend certain vaccines, such as: Influenza vaccine. This is recommended every year. Tetanus, diphtheria, and acellular pertussis (Tdap, Td) vaccine. You may need a Td booster every 10 years. Zoster vaccine. You may need this after age 29. Pneumococcal 13-valent conjugate (PCV13) vaccine. One dose is recommended after age 20. Pneumococcal polysaccharide (PPSV23) vaccine. One dose is recommended after age 6. Talk  to your health care  provider about which screenings and vaccines you need and how often you need them. This information is not intended to replace advice given to you by your health care provider. Make sure you discuss any questions you have with your health care provider. Document Released: 12/05/2015 Document Revised: 07/28/2016 Document Reviewed: 09/09/2015 Elsevier Interactive Patient Education  2017 [place] Prevention in the Home Falls can cause injuries. They can happen to people of all ages. There are many things you can do to make your home safe and to help prevent falls. What can I do on the outside of my home? Regularly fix the edges of walkways and driveways and fix any cracks. Remove anything that might make you trip as you walk through a door, such as a raised step or threshold. Trim any bushes or trees on the path to your home. Use bright outdoor lighting. Clear any walking paths of anything that might make someone trip, such as rocks or tools. Regularly check to see if handrails are loose or broken. Make sure that both sides of any steps have handrails. Any raised decks and porches should have guardrails on the edges. Have any leaves, snow, or ice cleared regularly. Use sand or salt on walking paths during winter. Clean up any spills in your garage right away. This includes oil or grease spills. What can I do in the bathroom? Use night lights. Install grab bars by the toilet and in the tub and shower. Do not use towel bars as grab bars. Use non-skid mats or decals in the tub or shower. If you need to sit down in the shower, use a plastic, non-slip stool. Keep the floor dry. Clean up any water that spills on the floor as soon as it happens. Remove soap buildup in the tub or shower regularly. Attach bath mats securely with double-sided non-slip rug tape. Do not have throw rugs and other things on the floor that can make you trip. What can I do in the bedroom? Use night lights. Make  sure that you have a light by your bed that is easy to reach. Do not use any sheets or blankets that are too big for your bed. They should not hang down onto the floor. Have a firm chair that has side arms. You can use this for support while you get dressed. Do not have throw rugs and other things on the floor that can make you trip. What can I do in the kitchen? Clean up any spills right away. Avoid walking on wet floors. Keep items that you use a lot in easy-to-reach places. If you need to reach something above you, use a strong step stool that has a grab bar. Keep electrical cords out of the way. Do not use floor polish or wax that makes floors slippery. If you must use wax, use non-skid floor wax. Do not have throw rugs and other things on the floor that can make you trip. What can I do with my stairs? Do not leave any items on the stairs. Make sure that there are handrails on both sides of the stairs and use them. Fix handrails that are broken or loose. Make sure that handrails are as long as the stairways. Check any carpeting to make sure that it is firmly attached to the stairs. Fix any carpet that is loose or worn. Avoid having throw rugs at the top or bottom of the stairs. If you do have throw rugs,  attach them to the floor with carpet tape. Make sure that you have a light switch at the top of the stairs and the bottom of the stairs. If you do not have them, ask someone to add them for you. What else can I do to help prevent falls? Wear shoes that: Do not have high heels. Have rubber bottoms. Are comfortable and fit you well. Are closed at the toe. Do not wear sandals. If you use a stepladder: Make sure that it is fully opened. Do not climb a closed stepladder. Make sure that both sides of the stepladder are locked into place. Ask someone to hold it for you, if possible. Clearly mark and make sure that you can see: Any grab bars or handrails. First and last steps. Where the  edge of each step is. Use tools that help you move around (mobility aids) if they are needed. These include: Canes. Walkers. Scooters. Crutches. Turn on the lights when you go into a dark area. Replace any light bulbs as soon as they burn out. Set up your furniture so you have a clear path. Avoid moving your furniture around. If any of your floors are uneven, fix them. If there are any pets around you, be aware of where they are. Review your medicines with your doctor. Some medicines can make you feel dizzy. This can increase your chance of falling. Ask your doctor what other things that you can do to help prevent falls. This information is not intended to replace advice given to you by your health care provider. Make sure you discuss any questions you have with your health care provider. Document Released: 09/04/2009 Document Revised: 04/15/2016 Document Reviewed: 12/13/2014 Elsevier Interactive Patient Education  2017 [employer].

## 2022-08-31 ENCOUNTER — Encounter: Payer: Self-pay | Admitting: Internal Medicine

## 2022-09-10 ENCOUNTER — Other Ambulatory Visit: Payer: Medicare HMO

## 2022-09-13 ENCOUNTER — Encounter: Payer: Self-pay | Admitting: Internal Medicine

## 2022-09-28 ENCOUNTER — Other Ambulatory Visit (INDEPENDENT_AMBULATORY_CARE_PROVIDER_SITE_OTHER): Payer: Medicare HMO

## 2022-09-28 DIAGNOSIS — Z23 Encounter for immunization: Secondary | ICD-10-CM

## 2022-10-07 ENCOUNTER — Other Ambulatory Visit: Payer: Self-pay | Admitting: Family Medicine

## 2022-10-07 DIAGNOSIS — M1990 Unspecified osteoarthritis, unspecified site: Secondary | ICD-10-CM

## 2022-10-07 NOTE — Telephone Encounter (Signed)
Is this okay to refill? 

## 2022-11-02 ENCOUNTER — Other Ambulatory Visit: Payer: Self-pay | Admitting: Medical

## 2022-11-02 DIAGNOSIS — M1990 Unspecified osteoarthritis, unspecified site: Secondary | ICD-10-CM

## 2022-11-02 NOTE — Telephone Encounter (Signed)
Refill request last apt 07/16/22.

## 2022-12-02 ENCOUNTER — Other Ambulatory Visit: Payer: Self-pay | Admitting: Family Medicine

## 2022-12-02 DIAGNOSIS — K219 Gastro-esophageal reflux disease without esophagitis: Secondary | ICD-10-CM

## 2022-12-02 DIAGNOSIS — M1990 Unspecified osteoarthritis, unspecified site: Secondary | ICD-10-CM

## 2022-12-02 NOTE — Telephone Encounter (Signed)
Refill request last apt 07/16/22 next apt 02/22/23.

## 2023-01-01 ENCOUNTER — Other Ambulatory Visit: Payer: Self-pay | Admitting: Family Medicine

## 2023-01-01 DIAGNOSIS — M1990 Unspecified osteoarthritis, unspecified site: Secondary | ICD-10-CM

## 2023-02-10 ENCOUNTER — Encounter: Payer: Self-pay | Admitting: Family Medicine

## 2023-02-10 ENCOUNTER — Ambulatory Visit (INDEPENDENT_AMBULATORY_CARE_PROVIDER_SITE_OTHER): Payer: Medicare HMO | Admitting: Family Medicine

## 2023-02-10 VITALS — BP 130/72 | HR 79 | Temp 98.0°F | Resp 16 | Wt 192.8 lb

## 2023-02-10 DIAGNOSIS — K5900 Constipation, unspecified: Secondary | ICD-10-CM

## 2023-02-10 LAB — HEMOCCULT GUIAC POC 1CARD (OFFICE): Fecal Occult Blood, POC: NEGATIVE

## 2023-02-10 NOTE — Progress Notes (Signed)
   Subjective:    Patient ID: Joseph West, male    DOB: 1941/09/29, 82 y.o.   MRN: [id number]  HPI He is here for consult concerning difficulty with his bowel habits.  Apparently this revolves around him getting a new set of teeth.  Prior to getting the teeth he had to change his dietary habits which did affect his bowel habits.  He mainly complains of constipation.  He has intermittently used MiraLAX in the past for this but not on a regular basis.   Review of Systems     Objective:   Physical Exam Alert and in no distress.  Abdominal exam shows no masses or tenderness with normal bowel sounds.  Rectal exam shows no masses with guaiac negative stool.       Assessment & Plan:  Constipation, unspecified constipation type

## 2023-02-22 ENCOUNTER — Encounter: Payer: Self-pay | Admitting: Family Medicine

## 2023-02-22 ENCOUNTER — Ambulatory Visit (INDEPENDENT_AMBULATORY_CARE_PROVIDER_SITE_OTHER): Payer: Medicare HMO | Admitting: Family Medicine

## 2023-02-22 VITALS — BP 128/70 | HR 66 | Temp 98.3°F | Ht 70.75 in | Wt 194.0 lb

## 2023-02-22 DIAGNOSIS — N1831 Chronic kidney disease, stage 3a: Secondary | ICD-10-CM | POA: Diagnosis not present

## 2023-02-22 DIAGNOSIS — Z0000 Encounter for general adult medical examination without abnormal findings: Secondary | ICD-10-CM | POA: Diagnosis not present

## 2023-02-22 DIAGNOSIS — I10 Essential (primary) hypertension: Secondary | ICD-10-CM | POA: Diagnosis not present

## 2023-02-22 DIAGNOSIS — Z131 Encounter for screening for diabetes mellitus: Secondary | ICD-10-CM | POA: Diagnosis not present

## 2023-02-22 DIAGNOSIS — K219 Gastro-esophageal reflux disease without esophagitis: Secondary | ICD-10-CM | POA: Diagnosis not present

## 2023-02-22 DIAGNOSIS — F17200 Nicotine dependence, unspecified, uncomplicated: Secondary | ICD-10-CM | POA: Diagnosis not present

## 2023-02-22 DIAGNOSIS — I714 Abdominal aortic aneurysm, without rupture, unspecified: Secondary | ICD-10-CM | POA: Diagnosis not present

## 2023-02-22 DIAGNOSIS — M1990 Unspecified osteoarthritis, unspecified site: Secondary | ICD-10-CM

## 2023-02-22 DIAGNOSIS — E785 Hyperlipidemia, unspecified: Secondary | ICD-10-CM | POA: Diagnosis not present

## 2023-02-22 DIAGNOSIS — J301 Allergic rhinitis due to pollen: Secondary | ICD-10-CM

## 2023-02-22 LAB — POCT GLYCOSYLATED HEMOGLOBIN (HGB A1C): Hemoglobin A1C: 6.4 % — AB (ref 4.0–5.6)

## 2023-02-22 MED ORDER — OMEPRAZOLE 40 MG PO CPDR: 40.0000 mg | DELAYED_RELEASE_CAPSULE | Freq: Every day | ORAL | 3 refills | Status: DC

## 2023-02-22 MED ORDER — HYDROCHLOROTHIAZIDE 12.5 MG PO CAPS: ORAL_CAPSULE | ORAL | 3 refills | Status: DC

## 2023-02-22 MED ORDER — PRAVASTATIN SODIUM 20 MG PO TABS: 20.0000 mg | ORAL_TABLET | Freq: Every day | ORAL | 3 refills | Status: DC

## 2023-02-22 NOTE — Progress Notes (Signed)
Complete physical exam  Patient: Joseph West   DOB: [birth year]   82 y.o. Male  MRN: [id number]  Subjective:    Chief Complaint  Patient presents with   Annual Exam    Had AWV with [facility] on 08/27/2022. Fasting. No additional concerns.     Joseph West is a 82 y.o. male who presents today for a complete physical exam. He reports consuming a general diet. Gym/ health club routine includes swimming. He generally feels fairly well. He reports sleeping fairly well.  He smokes approximately 5 cigarettes/day.  He does state that his bowel habits are now back to normal with the use of MiraLAX.  He does have evidence of AAA and will need to have a repeat in 3 years.  His reflux is under good control with daily use of Prilosec.  He does have arthritis and uses meloxicam on an as-needed basis.  Does have evidence of CKD.  His allergies are under good control.  He does not have additional problems to discuss today.    Most recent fall risk assessment:    02/22/2023    9:26 AM  [place] in the past year? 0  Number falls in past yr: 0  Injury with Fall? 0  Risk for fall due to : No Fall Risks  Follow up Falls evaluation completed     Most recent depression screenings:    08/27/2022    2:27 PM 02/17/2022    9:31 AM  PHQ 2/9 Scores  PHQ - 2 Score 0 0  PHQ- 9 Score 0     Vision:Not within last year  and Dental: Receives regular dental care    Patient Care Team: Denita Lung, MD as PCP - General (Family Medicine)   Outpatient Medications Prior to Visit  Medication Sig   meloxicam (MOBIC) 7.5 MG tablet TAKE 1 TABLET(7.5 MG) BY MOUTH DAILY   Multiple Vitamin (MULTIVITAMIN) capsule Take 1 capsule by mouth daily.   [DISCONTINUED] pravastatin (PRAVACHOL) 20 MG tablet Take 20 mg by mouth daily.   ALPRAZolam (XANAX) 0.25 MG tablet Take 1 tablet (0.25 mg total) by mouth 2 (two) times daily as needed for anxiety. (Patient not taking: Reported on 02/22/2023)   [DISCONTINUED]  atorvastatin (LIPITOR) 20 MG tablet Take 1 tablet (20 mg total) by mouth daily.   [DISCONTINUED] hydrochlorothiazide (MICROZIDE) 12.5 MG capsule TAKE 1 CAPSULE(12.5 MG) BY MOUTH DAILY   [DISCONTINUED] ofloxacin (OCUFLOX) 0.3 % ophthalmic solution  (Patient not taking: Reported on 02/17/2022)   [DISCONTINUED] omeprazole (PRILOSEC) 40 MG capsule TAKE 1 CAPSULE(40 MG) BY MOUTH DAILY   No facility-administered medications prior to visit.    Review of Systems  All other systems reviewed and are negative.         Objective:     BP 128/70   Pulse 66   Temp 98.3 F (36.8 C) (Oral)   Ht 5' 10.75" (1.797 m)   Wt 194 lb (88 kg)   SpO2 98% Comment: room air  BMI 27.25 kg/m    Physical Exam  Alert and in no distress. Tympanic membranes and canals are normal. Pharyngeal area is normal. Neck is supple without adenopathy or thyromegaly. Cardiac exam shows a regular sinus rhythm without murmurs or gallops. Lungs are clear to auscultation.  Results for orders placed or performed in visit on 02/22/23  POCT glycosylated hemoglobin (Hb A1C)  Result Value Ref Range   Hemoglobin A1C 6.4 (A) 4.0 - 5.6 %  Assessment & Plan:    Routine general medical examination at a health care facility - Plan: CBC with Differential/Platelet, Comprehensive metabolic panel, Lipid panel  Abdominal aortic aneurysm (AAA) without rupture, unspecified part  Seasonal allergic rhinitis due to pollen  Gastroesophageal reflux disease without esophagitis - Plan: omeprazole (PRILOSEC) 40 MG capsule  Arthritis  Stage 3a chronic kidney disease  Smoker  Hyperlipidemia, unspecified hyperlipidemia type - Plan: pravastatin (PRAVACHOL) 20 MG tablet, Lipid panel  Essential hypertension - Plan: hydrochlorothiazide (MICROZIDE) 12.5 MG capsule, CBC with Differential/Platelet, Comprehensive metabolic panel  Screening for diabetes mellitus - Plan: POCT glycosylated hemoglobin (Hb A1C) Recommend Tylenol and then use  of meloxicam on an as-needed basis for his arthritis symptoms. Discussed smoking with him and strongly encouraged him to cut out all tobacco. I discussed the hemoglobin A1c with him and encouraged him to continue with his present lifestyle in terms of diet and exercise. Did recommend using Prilosec on an as-needed basis rather than on a daily basis. Immunization History  Administered Date(s) Administered   COVID-19, mRNA, vaccine(Comirnaty)12 years and older 09/28/2022   Fluad Quad(high Dose 65+) 09/04/2019, 10/06/2020, 08/25/2021, 08/27/2022   Influenza, High Dose Seasonal PF 09/04/2013, 08/28/2015, 10/08/2016, 09/06/2017, 10/05/2017   Influenza-Unspecified 08/08/2018, 10/09/2019, 06/22/2021   Moderna Covid-19 Vaccine Bivalent Booster 4yrs & up 08/25/2021   Moderna Sars-Covid-2 Vaccination 12/14/2019, 01/18/2020, 09/18/2020   Pneumococcal Conjugate-13 08/28/2015, 09/07/2017   Pneumococcal Polysaccharide-23 03/25/2006   Td 11/22/2000   Tdap 01/30/2013, 06/02/2018   Zoster Recombinat (Shingrix) 06/02/2018, 08/08/2018   Zoster, Live 05/10/2006    Health Maintenance  Topic Date Due   INFLUENZA VACCINE  06/23/2023   Medicare Annual Wellness (AWV)  08/28/2023   DTaP/Tdap/Td (4 - Td or Tdap) 06/02/2028   Pneumonia Vaccine 29+ Years old  Completed   COVID-19 Vaccine  Completed   Zoster Vaccines- Shingrix  Completed   HPV VACCINES  Aged Out    Discussed health benefits of physical activity, and encouraged him to engage in regular exercise appropriate for his age and condition.  Problem List Items Addressed This Visit     AAA (abdominal aortic aneurysm)   Relevant Medications   hydrochlorothiazide (MICROZIDE) 12.5 MG capsule   pravastatin (PRAVACHOL) 20 MG tablet   Arthritis   GERD (gastroesophageal reflux disease)   Relevant Medications   omeprazole (PRILOSEC) 40 MG capsule   Hyperlipidemia   Relevant Medications   hydrochlorothiazide (MICROZIDE) 12.5 MG capsule   pravastatin  (PRAVACHOL) 20 MG tablet   Other Relevant Orders   Lipid panel   Seasonal allergic rhinitis due to pollen   Smoker   Stage 3a chronic kidney disease   Other Visit Diagnoses     Routine general medical examination at a health care facility    -  Primary   Relevant Orders   CBC with Differential/Platelet   Comprehensive metabolic panel   Lipid panel   Essential hypertension       Relevant Medications   hydrochlorothiazide (MICROZIDE) 12.5 MG capsule   pravastatin (PRAVACHOL) 20 MG tablet   Other Relevant Orders   CBC with Differential/Platelet   Comprehensive metabolic panel   Screening for diabetes mellitus       Relevant Orders   POCT glycosylated hemoglobin (Hb A1C) (Completed)      No follow-ups on file.     Jill Alexanders, MD

## 2023-02-23 LAB — LIPID PANEL
Chol/HDL Ratio: 3.3 ratio (ref 0.0–5.0)
Cholesterol, Total: 213 mg/dL — ABNORMAL HIGH (ref 100–199)
HDL: 64 mg/dL (ref >39)
LDL Chol Calc (NIH): 139 mg/dL — ABNORMAL HIGH (ref 0–99)
Triglycerides: 59 mg/dL (ref 0–149)
VLDL Cholesterol Cal: 10 mg/dL (ref 5–40)

## 2023-02-23 LAB — CBC WITH DIFFERENTIAL/PLATELET
Basophils Absolute: 0.1 10*3/uL (ref 0.0–0.2)
Basos: 1 %
EOS (ABSOLUTE): 0.4 10*3/uL (ref 0.0–0.4)
Eos: 6 %
Hematocrit: 42.4 % (ref 37.5–51.0)
Hemoglobin: 14 g/dL (ref 13.0–17.7)
Immature Grans (Abs): 0 10*3/uL (ref 0.0–0.1)
Immature Granulocytes: 0 %
Lymphocytes Absolute: 3.7 10*3/uL — ABNORMAL HIGH (ref 0.7–3.1)
Lymphs: 54 %
MCH: 27.6 pg (ref 26.6–33.0)
MCHC: 33 g/dL (ref 31.5–35.7)
MCV: 84 fL (ref 79–97)
Monocytes Absolute: 0.5 10*3/uL (ref 0.1–0.9)
Monocytes: 7 %
Neutrophils Absolute: 2.1 10*3/uL (ref 1.4–7.0)
Neutrophils: 32 %
Platelets: 252 10*3/uL (ref 150–450)
RBC: 5.07 x10E6/uL (ref 4.14–5.80)
RDW: 14.5 % (ref 11.6–15.4)
WBC: 6.8 10*3/uL (ref 3.4–10.8)

## 2023-02-23 LAB — COMPREHENSIVE METABOLIC PANEL
ALT: 13 IU/L (ref 0–44)
AST: 51 IU/L — ABNORMAL HIGH (ref 0–40)
Albumin/Globulin Ratio: 1.2 (ref 1.2–2.2)
Albumin: 4.1 g/dL (ref 3.7–4.7)
Alkaline Phosphatase: 105 IU/L (ref 44–121)
BUN/Creatinine Ratio: 13 (ref 10–24)
BUN: 20 mg/dL (ref 8–27)
Bilirubin Total: 0.4 mg/dL (ref 0.0–1.2)
CO2: 22 mmol/L (ref 20–29)
Calcium: 10 mg/dL (ref 8.6–10.2)
Chloride: 107 mmol/L — ABNORMAL HIGH (ref 96–106)
Creatinine, Ser: 1.54 mg/dL — ABNORMAL HIGH (ref 0.76–1.27)
Globulin, Total: 3.5 g/dL (ref 1.5–4.5)
Glucose: 99 mg/dL (ref 70–99)
Potassium: 4.8 mmol/L (ref 3.5–5.2)
Sodium: 143 mmol/L (ref 134–144)
Total Protein: 7.6 g/dL (ref 6.0–8.5)
eGFR: 44 mL/min/{1.73_m2} — ABNORMAL LOW (ref >59)

## 2023-02-23 MED ORDER — ROSUVASTATIN CALCIUM 20 MG PO TABS
20.0000 mg | ORAL_TABLET | Freq: Every day | ORAL | 3 refills | Status: DC
Start: 2023-02-23 — End: 2023-10-13

## 2023-02-23 NOTE — Addendum Note (Signed)
Addended by: Denita Lung on: 02/23/2023 12:29 PM   Modules accepted: Orders

## 2023-02-26 ENCOUNTER — Other Ambulatory Visit: Payer: Self-pay | Admitting: Family Medicine

## 2023-02-26 DIAGNOSIS — I10 Essential (primary) hypertension: Secondary | ICD-10-CM

## 2023-03-14 ENCOUNTER — Other Ambulatory Visit: Payer: Self-pay | Admitting: Family Medicine

## 2023-03-14 DIAGNOSIS — M1990 Unspecified osteoarthritis, unspecified site: Secondary | ICD-10-CM

## 2023-05-05 ENCOUNTER — Encounter: Payer: Self-pay | Admitting: Family Medicine

## 2023-05-05 ENCOUNTER — Ambulatory Visit (INDEPENDENT_AMBULATORY_CARE_PROVIDER_SITE_OTHER): Payer: Medicare HMO | Admitting: Family Medicine

## 2023-05-05 VITALS — BP 136/74 | HR 61 | Temp 97.9°F | Resp 16 | Wt 189.2 lb

## 2023-05-05 DIAGNOSIS — R051 Acute cough: Secondary | ICD-10-CM | POA: Diagnosis not present

## 2023-05-05 DIAGNOSIS — M1990 Unspecified osteoarthritis, unspecified site: Secondary | ICD-10-CM | POA: Diagnosis not present

## 2023-05-05 DIAGNOSIS — F17200 Nicotine dependence, unspecified, uncomplicated: Secondary | ICD-10-CM | POA: Diagnosis not present

## 2023-05-05 DIAGNOSIS — E785 Hyperlipidemia, unspecified: Secondary | ICD-10-CM

## 2023-05-05 DIAGNOSIS — K219 Gastro-esophageal reflux disease without esophagitis: Secondary | ICD-10-CM

## 2023-05-05 DIAGNOSIS — J301 Allergic rhinitis due to pollen: Secondary | ICD-10-CM

## 2023-05-05 MED ORDER — AZITHROMYCIN 500 MG PO TABS
500.0000 mg | ORAL_TABLET | Freq: Every day | ORAL | 0 refills | Status: DC
Start: 2023-05-05 — End: 2023-10-13

## 2023-05-05 NOTE — Progress Notes (Signed)
   Subjective:    Patient ID: Joseph West, male    DOB: 1941-07-17, 82 y.o.   MRN: [id number]  HPI Complains of a 1 week history of cough with intermittently productive mucus.  No sneezing, itchy watery eyes, rhinorrhea, nasal congestion, sore throat or earache.  He has been using OTC meds for the cough.  He does have reflux disease and does use Prilosec fairly regularly but does not have symptoms on a regular basis.  He does have some arthritis symptoms but is treating this symptomatically.  Allergies seem to be under good control.  He does continue to smoke but is slowly cutting back.   Review of Systems     Objective:   Physical Exam Alert and in no distress. Tympanic membranes and canals are normal. Pharyngeal area is normal. Neck is supple without adenopathy or thyromegaly. Cardiac exam shows a regular sinus rhythm without murmurs or gallops. Lungs are clear to auscultation.        Assessment & Plan:  Acute cough - Plan: azithromycin (ZITHROMAX) 500 MG tablet  Gastroesophageal reflux disease without esophagitis  Arthritis  Hyperlipidemia, unspecified hyperlipidemia type  Smoker  Seasonal allergic rhinitis due to pollen I will treat him like he has a bacterial infection and if no improvement might need to do further evaluation.  Recommend to use the Prilosec on an as-needed basis.  Encouraged him to continue to work on smoking.  Continue on Mobic, Prilosec on an as-needed basis and Crestor.

## 2023-08-30 ENCOUNTER — Ambulatory Visit: Payer: Medicare HMO

## 2023-08-30 VITALS — BP 130/70 | HR 79 | Temp 97.8°F | Ht 72.0 in | Wt 195.6 lb

## 2023-08-30 DIAGNOSIS — Z0000 Encounter for general adult medical examination without abnormal findings: Secondary | ICD-10-CM

## 2023-08-30 NOTE — Patient Instructions (Signed)
Joseph West , Thank you for taking time to come for your Medicare Wellness Visit. I appreciate your ongoing commitment to your health goals. Please review the following plan we discussed and let me know if I can assist you in the future.   Referrals/Orders/Follow-Ups/Clinician Recommendations: none  This is a list of the screening recommended for you and due dates:  Health Maintenance  Topic Date Due   Flu Shot  06/23/2023   COVID-19 Vaccine (7 - 2023-24 season) 07/24/2023   Medicare Annual Wellness Visit  08/29/2024   DTaP/Tdap/Td vaccine (4 - Td or Tdap) 06/02/2028   Pneumonia Vaccine  Completed   Zoster (Shingles) Vaccine  Completed   HPV Vaccine  Aged Out    Advanced directives: (In Chart) A copy of your advanced directives are scanned into your chart should your provider ever need it.  Next Medicare Annual Wellness Visit scheduled for next year: Yes  insert Preventive Care attachment Insert FALL PREVENTION attachment if needed

## 2023-08-30 NOTE — Progress Notes (Signed)
Subjective:   Joseph West is a 82 y.o. male who presents for Medicare Annual/Subsequent preventive examination.  Visit Complete: In person    Cardiac Risk Factors include: advanced age (>99men, >42 women);dyslipidemia;male gender     Objective:    Today's Vitals   08/30/23 1355  BP: 130/70  Pulse: 79  Temp: 97.8 F (36.6 C)  TempSrc: Oral  SpO2: 99%  Weight: 195 lb 9.6 oz (88.7 kg)  Height: 6' (1.829 m)   Body mass index is 26.53 kg/m.     08/30/2023    2:05 PM 08/27/2022    2:26 PM 02/17/2022    9:29 AM 02/12/2021    9:45 AM 02/12/2020    2:13 PM 12/20/2017   10:53 AM 08/28/2015    2:06 PM  Advanced Directives  Does Patient Have a Medical Advance Directive? Yes Yes Yes Yes Yes Yes No  Type of Estate agent of [place];Living will Healthcare Power of [place];Living will Living will Living will;Healthcare Power of [employer] Power of [place];Living will Living will   Does patient want to make changes to medical advance directive?   No - Patient declined No - Patient declined No - Patient declined No - Patient declined   Copy of Healthcare Power of Attorney in Chart? Yes - validated most recent copy scanned in chart (See row information) Yes - validated most recent copy scanned in chart (See row information)  Yes - validated most recent copy scanned in chart (See row information) No - copy requested    Would patient like information on creating a medical advance directive?       Yes - Educational materials given    Current Medications (verified) Outpatient Encounter Medications as of 08/30/2023  Medication Sig   Glucosamine Sulfate 500 MG CAPS Take 2 capsules by mouth daily.   hydrochlorothiazide (MICROZIDE) 12.5 MG capsule TAKE 1 CAPSULE(12.5 MG) BY MOUTH DAILY   Multiple Vitamin (MULTIVITAMIN) capsule Take 1 capsule by mouth daily.   rosuvastatin (CRESTOR) 20 MG tablet Take 1 tablet (20 mg total) by mouth daily.   ALPRAZolam (XANAX)  0.25 MG tablet Take 1 tablet (0.25 mg total) by mouth 2 (two) times daily as needed for anxiety. (Patient not taking: Reported on 02/22/2023)   azithromycin (ZITHROMAX) 500 MG tablet Take 1 tablet (500 mg total) by mouth daily. (Patient not taking: Reported on 08/30/2023)   meloxicam (MOBIC) 7.5 MG tablet TAKE 1 TABLET(7.5 MG) BY MOUTH DAILY (Patient not taking: Reported on 08/30/2023)   omeprazole (PRILOSEC) 40 MG capsule Take 1 capsule (40 mg total) by mouth daily. (Patient not taking: Reported on 08/30/2023)   No facility-administered encounter medications on file as of 08/30/2023.    Allergies (verified) Patient has no known allergies.   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 Jarold Motto   Family History  Problem Relation Age of Onset   Coronary artery disease Mother        died in late 69s   Other Father  died age 72s, unknown cause   Diabetes Sister    Other Brother        unknown cause of death   Alcohol abuse Sister    Heart disease Sister        stents   Heart disease Sister        stents   Colon cancer Neg Hx    Esophageal cancer Neg Hx    Rectal cancer Neg Hx    Stomach cancer Neg Hx    Social History   Socioeconomic History   Marital status: Married    Spouse name: Not on file   Number of children: 3   Years of education: Not on file   Highest education level: Not on file  Occupational History   Occupation: retired Chief Strategy Officer: [employer]  Tobacco Use   Smoking status: Former    Current packs/day: 0.00    Types:  Cigarettes    Quit date: 02/21/2023    Years since quitting: 0.5   Smokeless tobacco: Never   Tobacco comments:    Smokes 4-5 cigarettes daily  Vaping Use   Vaping status: Never Used  Substance and Sexual Activity   Alcohol use: No    Alcohol/week: 0.0 standard drinks of alcohol   Drug use: No   Sexual activity: Yes    Partners: Male  Other Topics Concern   Not on file  Social History Narrative   Not on file   Social Determinants of Health   Financial Resource Strain: Low Risk  (08/30/2023)   Overall Financial Resource Strain (CARDIA)    Difficulty of Paying Living Expenses: Not hard at all  Food Insecurity: No Food Insecurity (08/30/2023)   Hunger Vital Sign    Worried About Running Out of Food in the Last Year: Never true    Ran Out of Food in the Last Year: Never true  Transportation Needs: No Transportation Needs (08/30/2023)   PRAPARE - Administrator, Civil Service (Medical): No    Lack of Transportation (Non-Medical): No  Physical Activity: Sufficiently Active (08/30/2023)   Exercise Vital Sign    Days of Exercise per Week: 4 days    Minutes of Exercise per Session: 60 min  Stress: No Stress Concern Present (08/30/2023)   [employer] of Occupational Health - Occupational Stress Questionnaire    Feeling of Stress : Not at all  Social Connections: Moderately Integrated (08/30/2023)   Social Connection and Isolation Panel [NHANES]    Frequency of Communication with Friends and Family: Twice a week    Frequency of Social Gatherings with Friends and Family: Once a week    Attends Religious Services: More than 4 times per year    Active Member of [employer] or Organizations: No    Attends Engineer, structural: Never    Marital Status: Married    Tobacco Counseling Counseling given: Not Answered Tobacco comments: Smokes 4-5 cigarettes daily   Clinical Intake:  Pre-visit preparation completed: Yes  Pain : No/denies pain     Nutritional Status:  BMI 25 -29 Overweight Nutritional Risks: None Diabetes: No  How often do you need to have someone help you when you read instructions, pamphlets, or other written materials from your doctor or pharmacy?: 1 - Never  Interpreter Needed?: No  Information entered by :: NAllen LPN   Activities of Daily Living    08/30/2023    1:57 PM  In your present state of health,  do you have any difficulty performing the following activities:  Hearing? 0  Vision? 0  Difficulty concentrating or making decisions? 0  Walking or climbing stairs? 0  Dressing or bathing? 0  Doing errands, shopping? 0  Preparing Food and eating ? N  Using the Toilet? N  In the past six months, have you accidently leaked urine? N  Do you have problems with loss of bowel control? N  Managing your Medications? N  Managing your Finances? N  Housekeeping or managing your Housekeeping? N    Patient Care Team: Ronnald Nian, MD as PCP - General (Family Medicine)  Indicate any recent Medical Services you may have received from other than [facility] providers in the past year (date may be approximate).     Assessment:   This is a routine wellness examination for Kinsey.  Hearing/Vision screen Hearing Screening - Comments:: Denies hearing issues Vision Screening - Comments:: No regular eye exams   Goals Addressed             This Visit's Progress    Patient Stated       08/30/2023, stay healthy       Depression Screen    08/30/2023    2:07 PM 08/27/2022    2:27 PM 02/17/2022    9:31 AM 02/12/2021    9:46 AM 02/12/2020    1:43 PM 12/20/2017   10:29 AM 10/08/2016    9:59 AM  PHQ 2/9 Scores  PHQ - 2 Score 0 0 0 0 0 0 0  PHQ- 9 Score 0 0         Fall Risk    08/30/2023    2:06 PM 02/22/2023    9:26 AM 02/10/2023    9:57 AM 08/27/2022    2:26 PM 02/17/2022    9:30 AM  Fall Risk   Falls in the past year? 0 0 0 0 0  Number falls in past yr: 0 0 0 0 0  Injury with Fall? 0 0 0 0 0  Risk for fall due to :  Medication side effect No Fall Risks No Fall Risks Medication side effect No Fall Risks  Follow up Falls prevention discussed;Falls evaluation completed Falls evaluation completed Falls evaluation completed Falls prevention discussed;Education provided;Falls evaluation completed Falls evaluation completed    MEDICARE RISK AT HOME: Medicare Risk at Home Any stairs in or around the home?: No If so, are there any without handrails?: No Home free of loose throw rugs in walkways, pet beds, electrical cords, etc?: Yes Adequate lighting in your home to reduce risk of falls?: Yes Life alert?: No Use of a cane, walker or w/c?: No Grab bars in the bathroom?: No Shower chair or bench in shower?: No Elevated toilet seat or a handicapped toilet?: No  TIMED UP AND GO:  Was the test performed?  Yes  Length of time to ambulate 10 feet: 5 sec Gait steady and fast without use of assistive device    Cognitive Function:    02/17/2022   12:55 PM  MMSE - Mini Mental State Exam  Orientation to time 4  Orientation to Place 5  Registration 3  Attention/ Calculation 0  Recall 3  Language- name 2 objects 2  Language- repeat 1  Language- follow 3 step command 3  Language- read & follow direction 1  Write a sentence 1  Copy design 1  Total score 24        08/30/2023    2:08 PM 08/27/2022  2:30 PM 02/17/2022    9:33 AM  6CIT Screen  What Year? 0 points 0 points 0 points  What month? 0 points 0 points 0 points  What time? 0 points 0 points 0 points  Count back from 20 0 points 0 points 0 points  Months in reverse 4 points 4 points 4 points  Repeat phrase 0 points 0 points 10 points  Total Score 4 points 4 points 14 points    Immunizations Immunization History  Administered Date(s) Administered   Fluad Quad(high Dose 65+) 09/04/2019, 10/06/2020, 08/25/2021, 08/27/2022   Influenza, High Dose Seasonal PF 09/04/2013, 08/28/2015, 10/08/2016, 09/06/2017, 10/05/2017   Influenza-Unspecified  08/08/2018, 10/09/2019, 06/22/2021   Moderna Covid-19 Vaccine Bivalent Booster 21yrs & up 08/25/2021   Moderna Sars-Covid-2 Vaccination 12/14/2019, 01/18/2020, 09/18/2020   Pfizer(Comirnaty)Fall Seasonal Vaccine 12 years and older 09/28/2022   Pneumococcal Conjugate-13 08/28/2015, 09/07/2017   Pneumococcal Polysaccharide-23 03/25/2006   Td 11/22/2000   Tdap 01/30/2013, 06/02/2018   Zoster Recombinant(Shingrix) 06/02/2018, 08/08/2018   Zoster, Live 05/10/2006    TDAP status: Up to date  Flu Vaccine status: Up to date  Pneumococcal vaccine status: Up to date  Covid-19 vaccine status: Completed vaccines  Qualifies for Shingles Vaccine? Yes   Zostavax completed Yes   Shingrix Completed?: Yes  Screening Tests Health Maintenance  Topic Date Due   INFLUENZA VACCINE  06/23/2023   COVID-19 Vaccine (7 - 2023-24 season) 07/24/2023   Medicare Annual Wellness (AWV)  08/29/2024   DTaP/Tdap/Td (4 - Td or Tdap) 06/02/2028   Pneumonia Vaccine 59+ Years old  Completed   Zoster Vaccines- Shingrix  Completed   HPV VACCINES  Aged Out    Health Maintenance  Health Maintenance Due  Topic Date Due   INFLUENZA VACCINE  06/23/2023   COVID-19 Vaccine (7 - 2023-24 season) 07/24/2023    Colorectal cancer screening: No longer required.   Lung Cancer Screening: (Low Dose CT Chest recommended if Age 42-80 years, 20 pack-year currently smoking OR have quit w/in 15years.) does not qualify.   Lung Cancer Screening Referral: no  Additional Screening:  Hepatitis C Screening: does not qualify;   Vision Screening: Recommended annual ophthalmology exams for early detection of glaucoma and other disorders of the eye. Is the patient up to date with their annual eye exam?  No  Who is the provider or what is the name of the office in which the patient attends annual eye exams? none If pt is not established with a provider, would they like to be referred to a provider to establish care? No .   Dental  Screening: Recommended annual dental exams for proper oral hygiene  Diabetic Foot Exam: n/a  Community Resource Referral / Chronic Care Management: CRR required this visit?  No   CCM required this visit?  No     Plan:     I have personally reviewed and noted the following in the patient's chart:   Medical and social history Use of alcohol, tobacco or illicit drugs  Current medications and supplements including opioid prescriptions. Patient is not currently taking opioid prescriptions. Functional ability and status Nutritional status Physical activity Advanced directives List of other physicians Hospitalizations, surgeries, and ER visits in previous 12 months Vitals Screenings to include cognitive, depression, and falls Referrals and appointments  In addition, I have reviewed and discussed with patient certain preventive protocols, quality metrics, and best practice recommendations. A written personalized care plan for preventive services as well as general preventive health recommendations were provided to  patient.     Barb Merino, LPN   [id number]   After Visit Summary: (In Person-Printed) AVS printed and given to the patient  Nurse Notes: none

## 2023-10-01 IMAGING — US US AORTA
1 series · 14 of 16 positions shown · non-contrast
Comparison: Abdominal CT 11/28/2018

CLINICAL DATA: Follow-up abdominal aortic aneurysm without rupture.

EXAM:
ULTRASOUND OF ABDOMINAL AORTA
TECHNIQUE: Ultrasound examination of the abdominal aorta and proximal common
iliac arteries was performed to evaluate for aneurysm. Additional
color and Doppler images of the distal aorta were obtained to
document patency.

[Series 1: us aorta · 0.22mm/px · 14 of 16 slices shown]
[im 1/16]
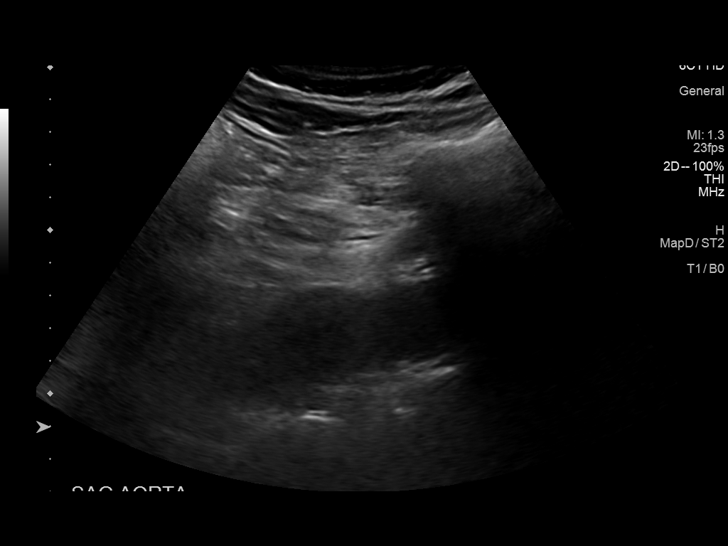
[im 2/16]
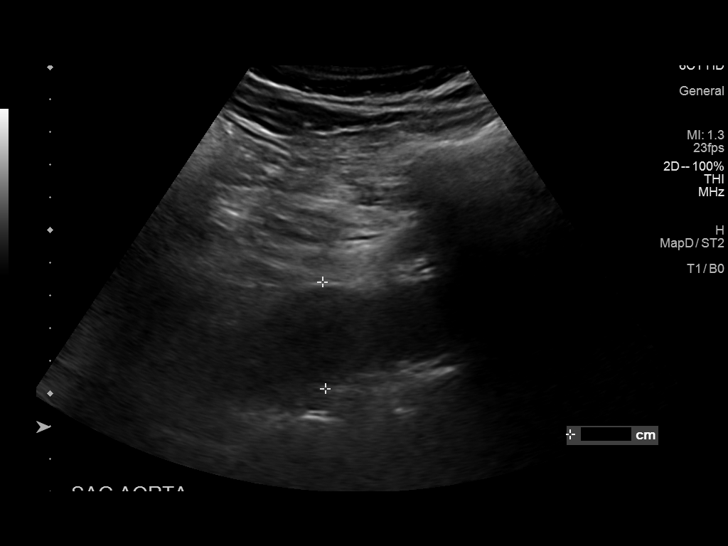
[im 3/16]
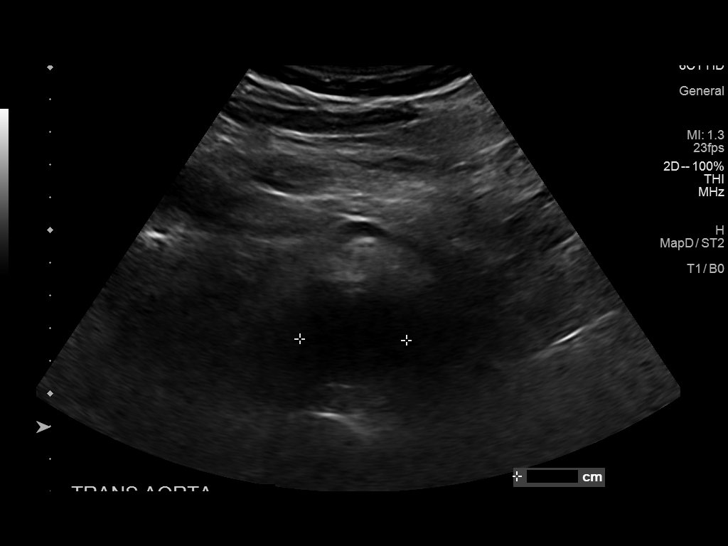
[im 5/16]
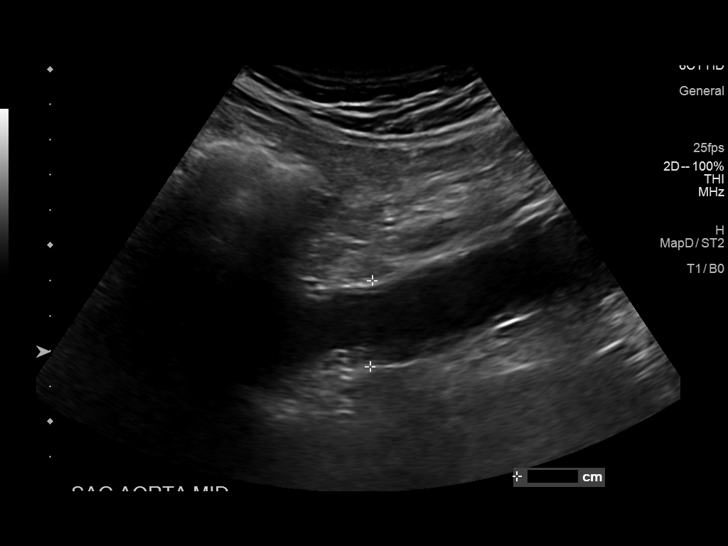
[im 6/16]
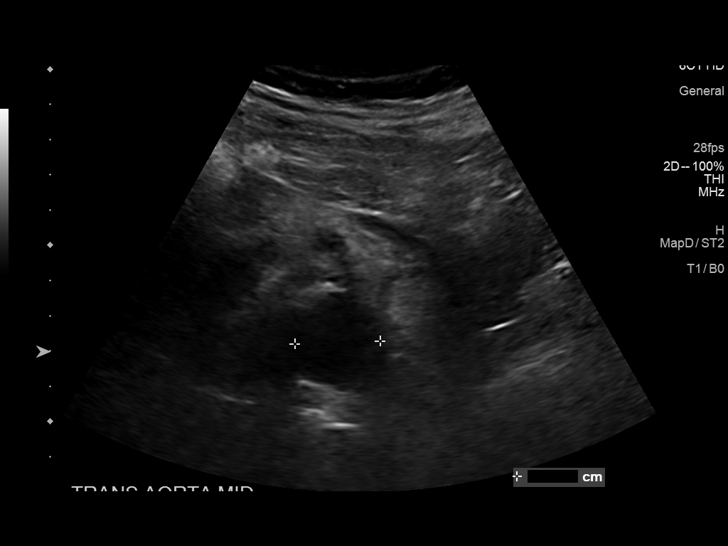
[im 7/16]
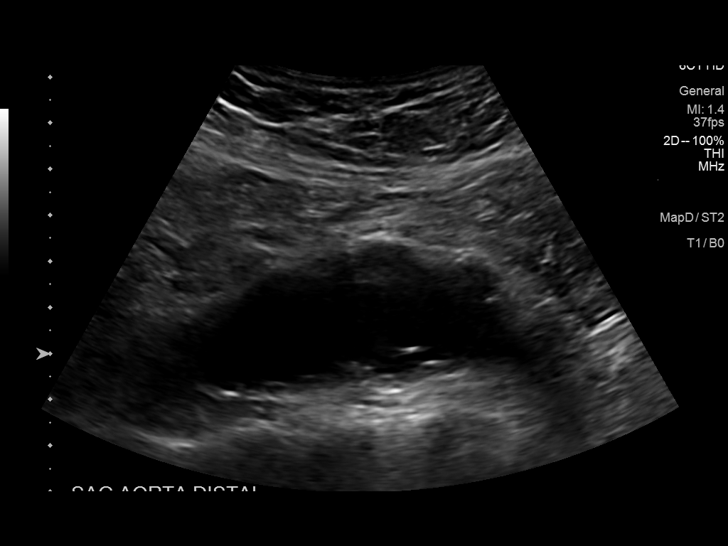
[im 8/16]
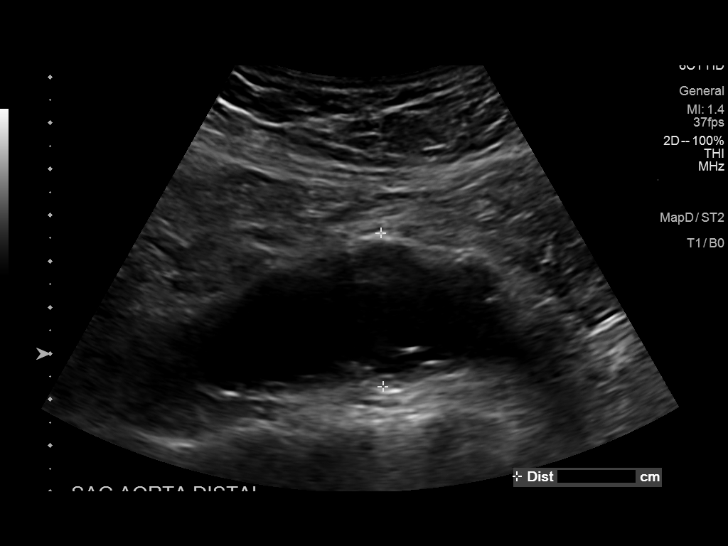
[im 9/16]
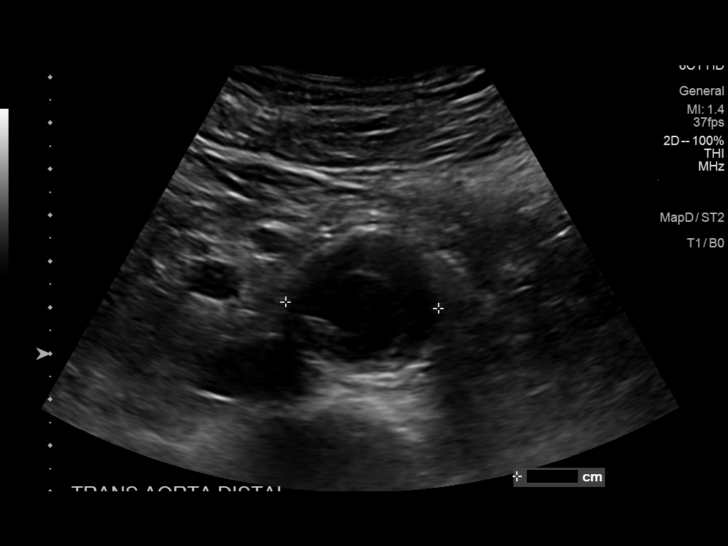
[im 10/16]
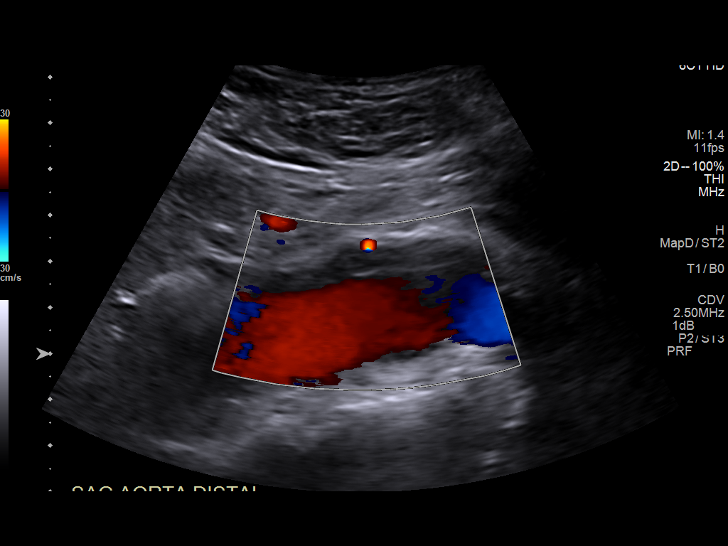
[im 11/16]
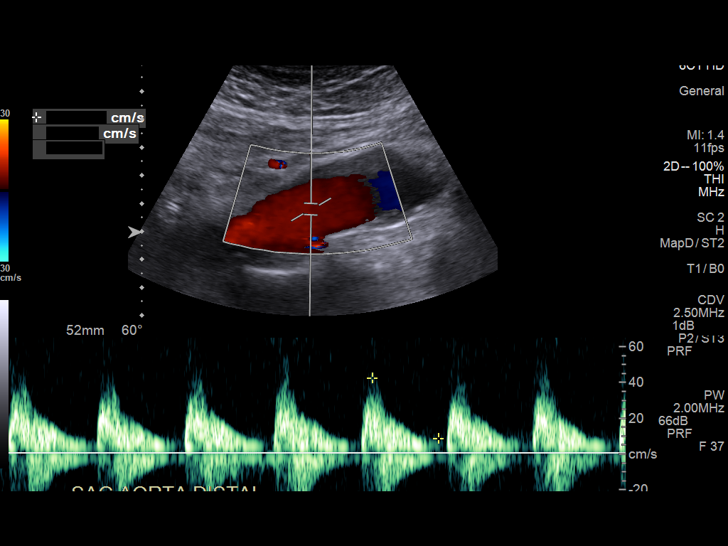
[im 13/16]
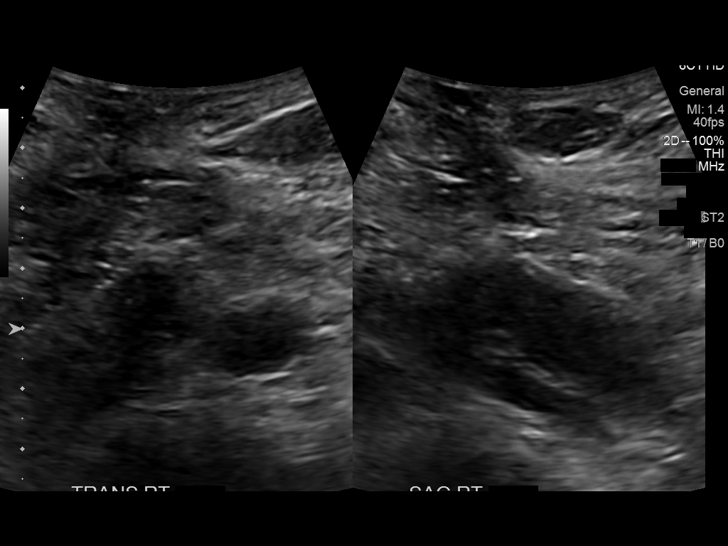
[im 14/16]
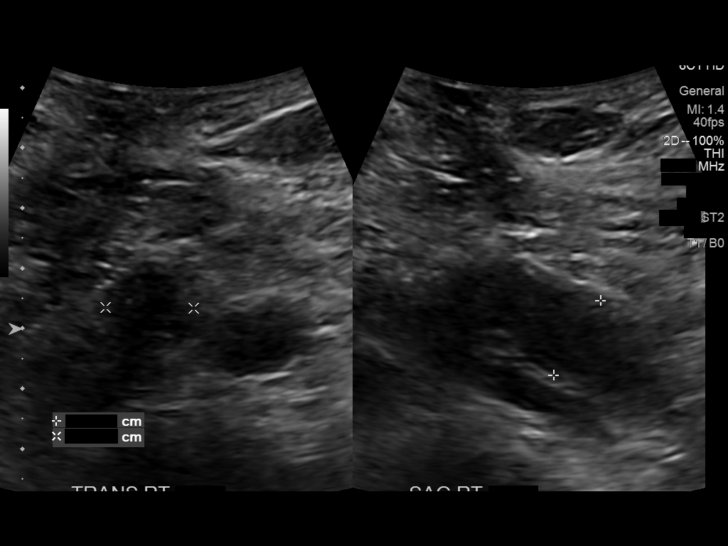
[im 15/16]
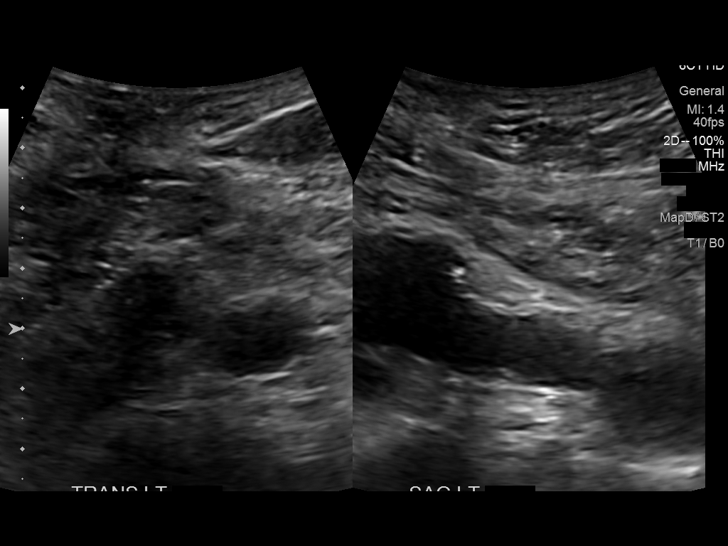
[im 16/16]
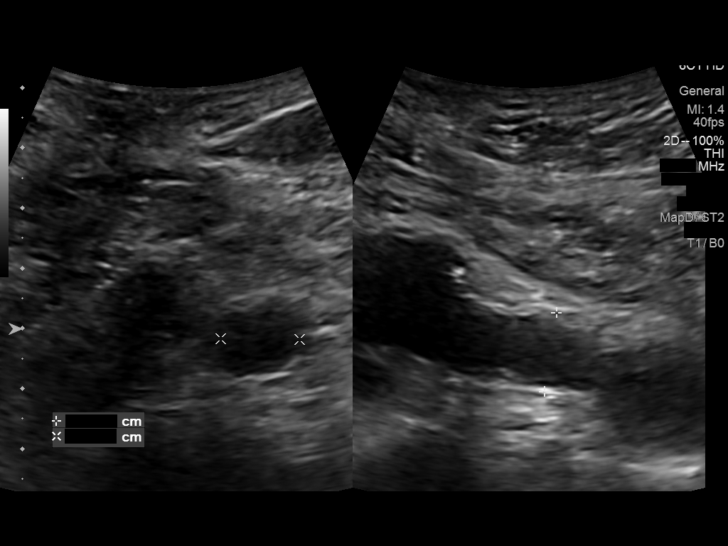

[14 of 16 positions shown; findings below may reference images not displayed]

FINDINGS: Abdominal aortic measurements as follows:

Proximal:  3.3 x 3.3 cm

Mid:  2.4 x 2.4 cm

Distal:  3.3 x 3.3 cm, eccentric mural thrombus in the aneurysm sac.
Patent: Yes, peak systolic velocity is 43 cm/s

Right common iliac artery: 1.5 x 1.5 cm

Left common iliac artery: 0.3 x 1.3 cm
IMPRESSION: Abdominal aortic aneurysm maximal dimension 3.3 cm. This previously
measured 3.2 cm on CT. For an aneurysm of this size: Recommend
follow-up ultrasound every 3 years. This recommendation follows ACR
consensus guidelines: White Paper of the ACR Incidental Findings
Committee II on Vascular Findings. [HOSPITAL] 9595;

## 2023-10-13 ENCOUNTER — Ambulatory Visit: Payer: Medicare HMO | Admitting: Family Medicine

## 2023-10-13 ENCOUNTER — Encounter: Payer: Self-pay | Admitting: Family Medicine

## 2023-10-13 VITALS — BP 120/68 | HR 68 | Temp 98.1°F | Ht 72.0 in | Wt 193.0 lb

## 2023-10-13 DIAGNOSIS — R059 Cough, unspecified: Secondary | ICD-10-CM

## 2023-10-13 DIAGNOSIS — J302 Other seasonal allergic rhinitis: Secondary | ICD-10-CM | POA: Diagnosis not present

## 2023-10-13 LAB — POC COVID19 BINAXNOW: SARS Coronavirus 2 Ag: NEGATIVE

## 2023-10-13 NOTE — Patient Instructions (Addendum)
Stay well hydrated. Take an antihistamine to treat your allergies--such as claritin or zyrtec or allegra. I also recommend taking Mucinex 12 hour (plain) twice daily (or a different medication that contains guaifenesin, which is the expectorant to loosen up phlegm/mucus, and follow the directions on the package).  Let us know if your symptoms change--fever, discolored mucus or phlegm, sinus pain, shortness of breath, or other concerns.

## 2023-10-13 NOTE — Progress Notes (Signed)
Chief Complaint  Patient presents with   Cough    Chest congestion x 1 week, cough every now and then. No home covid tests.    He plays a lot of golf (3x/week). After golf he swims at the [employer] [facility] after golf, then uses the sauna and steam room. He started with some chest congestion and coughing at night about a week ago. Denies runny nose, sniffles, sneezing, sore throat or ear pain. Denies sinus pain. He does have some nasal congestion, blows his nose mostly in the mornings, and mucus is clear-white, not discolored. Cough is nonproductive.  Only rarely gets up phlegm, and doesn't look at it. His wife made him come today, due to ongoing cough. He does give a history of having seasonal allergies in the Fall.  He took some Nyquil or other similar OTC medication. He fell asleep faster, thinks it helped. Currently he feels good.  He denies any chest congestion currently. Last big coughing spell was yesterday.   PMH, PSH, SH reviewed  Outpatient Encounter Medications as of 10/13/2023  Medication Sig Note   Glucosamine Sulfate 500 MG CAPS Take 2 capsules by mouth daily.    hydrochlorothiazide (MICROZIDE) 12.5 MG capsule TAKE 1 CAPSULE(12.5 MG) BY MOUTH DAILY    Multiple Vitamin (MULTIVITAMIN) capsule Take 1 capsule by mouth daily. 10/13/2023: Alive Men 50+   omeprazole (PRILOSEC) 40 MG capsule Take 1 capsule (40 mg total) by mouth daily.    pravastatin (PRAVACHOL) 20 MG tablet Take 20 mg by mouth daily.    [DISCONTINUED] rosuvastatin (CRESTOR) 20 MG tablet Take 1 tablet (20 mg total) by mouth daily.    [DISCONTINUED] ALPRAZolam (XANAX) 0.25 MG tablet Take 1 tablet (0.25 mg total) by mouth 2 (two) times daily as needed for anxiety. (Patient not taking: Reported on 02/22/2023)    [DISCONTINUED] azithromycin (ZITHROMAX) 500 MG tablet Take 1 tablet (500 mg total) by mouth daily. (Patient not taking: Reported on 08/30/2023)    [DISCONTINUED] meloxicam (MOBIC) 7.5 MG tablet TAKE 1 TABLET(7.5 MG) BY MOUTH  DAILY (Patient not taking: Reported on 08/30/2023)    No facility-administered encounter medications on file as of 10/13/2023.   No Known Allergies  ROS: See HPI. No fever/chills No n/v, bleeding bruising, rashes No urinary complaints. Has some constipation. No edema, chest pain, shortness of breath or wheezing.    PHYSICAL EXAM:  BP 120/68   Pulse 68   Temp 98.1 F (36.7 C) (Tympanic)   Ht 6' (1.829 m)   Wt 193 lb (87.5 kg)   BMI 26.18 kg/m   Well-appearing, pleasant male in no distress. He is speaking easily and comfortably. Occasional throat-clearing, no coughing. HEENT: conjunctiva and sclera are clear Nasal mucosa is mildly edematous, clear mucus, no erythema.  OP is clear. Sinuses nontender TM's and EAC's normal Neck: no lymphadenopathy or mass Heart: regular rate and rhythm Lungs: clear bilaterally Psych: normal mood, affect, hygiene and grooming Neuro: alert and oriented, cranial nerves grossly intact, normal gait.  COVID test negative   ASSESSMENT/PLAN:  Seasonal allergies - antihistamine and mucinex recommended.  s/sx bacterial infection reviewed, f/u if worsening  Cough, unspecified type - related to PND, likely from allergies.  antihistamine and mucinex recommended  Stay well hydrated. Take an antihistamine to treat your allergies--such as claritin or zyrtec or allegra. I also recommend taking Mucinex 12 hour twice daily (or a different medication that contains guaifenesin, which is the expectorant to loosen up phlegm/mucus, and follow the directions on the package).  Let us  know if your symptoms change--fever, discolored mucus or phlegm, sinus pain, shortness of breath, or other concerns.

## 2023-10-25 ENCOUNTER — Ambulatory Visit (INDEPENDENT_AMBULATORY_CARE_PROVIDER_SITE_OTHER): Payer: Medicare HMO | Admitting: Family Medicine

## 2023-10-25 VITALS — BP 124/82 | HR 77 | Temp 98.1°F | Wt 191.6 lb

## 2023-10-25 DIAGNOSIS — J0110 Acute frontal sinusitis, unspecified: Secondary | ICD-10-CM

## 2023-10-25 MED ORDER — AMOXICILLIN-POT CLAVULANATE 875-125 MG PO TABS
1.0000 | ORAL_TABLET | Freq: Two times a day (BID) | ORAL | 0 refills | Status: DC
Start: 2023-10-25 — End: 2023-11-21

## 2023-10-25 NOTE — Progress Notes (Signed)
   Subjective:    Patient ID: Joseph West, male    DOB: 1941-06-30, 82 y.o.   MRN: [id number]  HPI He is here for a recheck.  He was seen by Dr. Lynelle Doctor on November 21 and treated for his allergies.  He is continue to have difficulty with a cough, nasal congestion, slight fatigue and also frontal sinus pressure especially when he blows his nose.  No fever, chills, sore throat, earache, sneezing, itchy watery eyes.   Review of Systems     Objective:    Physical Exam Alert and in no distress. Tympanic membranes and canals are normal. Pharyngeal area is normal. Neck is supple without adenopathy or thyromegaly. Cardiac exam shows a regular sinus rhythm without murmurs or gallops. Lungs are clear to auscultation. Slight tenderness to palpation over the frontal sinuses.       Assessment & Plan:  Acute frontal sinusitis, recurrence not specified - Plan: amoxicillin-clavulanate (AUGMENTIN) 875-125 MG tablet Since he has not really responded to the allergies I think is reasonable to see if he will respond to an antibiotic.  He was comfortable with that.  He will keep me informed as to how he is doing.

## 2023-11-14 ENCOUNTER — Telehealth: Payer: Self-pay | Admitting: Family Medicine

## 2023-11-14 NOTE — Telephone Encounter (Signed)
Joseph West came in today, he says he is still having a dry cough and is asking for any OTC recommendations from you.

## 2023-11-21 ENCOUNTER — Ambulatory Visit (INDEPENDENT_AMBULATORY_CARE_PROVIDER_SITE_OTHER): Payer: Medicare HMO | Admitting: Family Medicine

## 2023-11-21 ENCOUNTER — Encounter: Payer: Self-pay | Admitting: Family Medicine

## 2023-11-21 VITALS — BP 112/70 | HR 84 | Temp 98.0°F | Ht 72.0 in | Wt 187.8 lb

## 2023-11-21 DIAGNOSIS — R052 Subacute cough: Secondary | ICD-10-CM

## 2023-11-21 DIAGNOSIS — J309 Allergic rhinitis, unspecified: Secondary | ICD-10-CM | POA: Diagnosis not present

## 2023-11-21 DIAGNOSIS — K5900 Constipation, unspecified: Secondary | ICD-10-CM | POA: Diagnosis not present

## 2023-11-21 NOTE — Patient Instructions (Addendum)
Your exam doesn't show any ongoing infection.  It does show that you have allergies.  I suspect that your cough is from ongoing postnasal drainage, and that it why the cough is mainly at night. Use an extra pillow to keep the head of the bed elevated--this can help with the drainage. Restart an antihistamine (like you took before--either claritin, allegra or zyrtec). This will help dry up any drainage. You can also use a nasal steroid spray such as Flonase. You can get this without a prescription.  You use 1-2 gentle sniffs into each nostril every day.  This can take up to 1-2 weeks to see the full effect, but you can use it longterm safely, if needed.  If starting the flonase, use the antihistamine tablets daily along with it until the flonase starts working, then continue [employer] and use the claritin/zyrtec/allegra (whichever one you got) only if/when needed.  If your develop sinus pain and discolored mucus, please let us know, as this indicates another infection that will need an antibiotic. There is no evidence of infection today.  I also recommend taking Mucinex 12 hour tablet twice daily, and a delsym syrup at bedtime (if needed for cough).  The mucinex loosens up any mucus that gets into the throat  and chest, and can help with cough.  The delsym (dextromethorphan) is a cough suppressant. Use that only at bedtime, since that is when you are coughing. I don't think you will need this longer than a week at most.  Stop the Robitussin DM (since the Mucinex has the same ingredient, and you aren't coughing during the day). Stop the Nyquil and use the mucinex and Delsym instead.  If your cough persists, go to [facility] [phone]) for a chest x-ray.  This can take potentially 1-2 weeks for them to read the x-ray.  If you are getting worse, have a fever, discolored mucus--please let us know and we can get them to read it faster.  Try and use the laxative less frequently. Work harder on  getting more fiber in your diet (see handout)--either through food, or a fiber supplement like Benefiber or Metamucil. Continue to drink plenty of water. Using the dulcolax sparingly.

## 2023-11-21 NOTE — Progress Notes (Signed)
Chief Complaint  Patient presents with   Cough    Chest congestion/cough x 3 weeks. Saw [facility] 10/25/23 and was given augmentin-did not really help. Cough is worse at night. Not really coughing much up. Wife made [place] tea, drinking every morning. Finished robitussin and Nyquil-didn't help much either. Patient has been unable to workout. He is wanting to know if you could order a CXR.    Medical Question    Currently taking 2 dulcolax about once a week for the last few months. He wants to make sure this is safe, long term.     11/21 saw me, treated for allergies. 12/3 saw Dr. Susann Givens, was diagnosed with frontal sinusitis and treated with augmentin. He no longer has any sinus pain. He continues to have cough at night. He tried taking rubitussin DM during the day and Nyquil at night, per Dr. Jola Babinski suggestion. He has been taking these--helped during the day, but was still coughing at night.  He denies any nasal drainage, does have some postnasal drainage with throat-clearing.  Cough is only at night.  No cough during the day. Denies chest tightness, wheezing, or shortness of breath. Only occasionally coughs up phlegm, which is white in color. Denies any color to it.   PMH, PSH, SH reviewed  Outpatient Encounter Medications as of 11/21/2023  Medication Sig Note   bisacodyl 5 MG EC tablet Take 10 mg by mouth daily as needed for moderate constipation. 11/21/2023: Takes 2 once a week   Glucosamine Sulfate 500 MG CAPS Take 2 capsules by mouth daily.    hydrochlorothiazide (MICROZIDE) 12.5 MG capsule TAKE 1 CAPSULE(12.5 MG) BY MOUTH DAILY    Multiple Vitamin (MULTIVITAMIN) capsule Take 1 capsule by mouth daily. 10/13/2023: Alive Men 50+   pravastatin (PRAVACHOL) 20 MG tablet Take 20 mg by mouth daily.    [DISCONTINUED] amoxicillin-clavulanate (AUGMENTIN) 875-125 MG tablet Take 1 tablet by mouth 2 (two) times daily. (Patient not taking: Reported on 11/21/2023)    [DISCONTINUED] omeprazole  (PRILOSEC) 40 MG capsule Take 1 capsule (40 mg total) by mouth daily. (Patient not taking: Reported on 10/25/2023)    No facility-administered encounter medications on file as of 11/21/2023.   No Known Allergies  ROS: No f/c, n/v/d. No headaches, dizziness,chest pain or shortness of breath. No sinus pain.  +cough per HPI.   PHYSICAL EXAM:  BP 112/70   Pulse 84   Temp 98 F (36.7 C) (Tympanic)   Ht 6' (1.829 m)   Wt 187 lb 12.8 oz (85.2 kg)   SpO2 97%   BMI 25.47 kg/m   Pleasant male, who appears slightly younger than stated age. He is in no distress. No throat-clearing or coughing during visit HEENT: conjunctiva and sclera are clear, EOMI. Nasal mucosa is moderately edematous, no erythema, no drainage. Sinuses are nontender. OP is clear TM's and EAC's are normal. Neck: no lymphadenopathy or mass Heart: regular rate and rhythm Lungs: clear bilaterally, no wheezes, rales, ronchi Extremities: no edema Psych: normal mood, affect, hygiene and grooming Neuro: alert and oriented, cranial nerves grossly intact, normal gait.    ASSESSMENT/PLAN:  Allergic rhinitis, unspecified seasonality, unspecified trigger - discussed use of antihistamines, flonase, mucinex and delsym syrup prn  Subacute cough - suspect related to PND from allergies. If not improving with allergy treatment, can go for CXR - Plan: DG Chest 2 View  Constipation, unspecified constipation type - reviewed recs for high fiber diet, and/or fiber supplements. Don't recommend regular use of laxatives (unless using osmostic  such as miralax)  Your exam doesn't show any ongoing infection.  It does show that you have allergies.  I suspect that your cough is from ongoing postnasal drainage, and that it why the cough is mainly at night. Use an extra pillow to keep the head of the bed elevated--this can help with the drainage. Restart an antihistamine (like you took before--either claritin, allegra or zyrtec). This will help dry  up any drainage. You can also use a nasal steroid spray such as Flonase. You can get this without a prescription.  You use 1-2 gentle sniffs into each nostril every day.  This can take up to 1-2 weeks to see the full effect, but you can use it longterm safely, if needed.  If starting the flonase, use the antihistamine tablets daily along with it until the flonase starts working, then continue [employer] and use the claritin/zyrtec/allegra (whichever one you got) only if/when needed.  If your develop sinus pain and discolored mucus, please let us know, as this indicates another infection that will need an antibiotic. There is no evidence of infection today.  I also recommend taking Mucinex 12 hour tablet twice daily, and a delsym syrup at bedtime (if needed for cough).  The mucinex loosens up any mucus that gets into the throat  and chest, and can help with cough.  The delsym (dextromethorphan) is a cough suppressant. Use that only at bedtime, since that is when you are coughing. I don't think you will need this longer than a week at most.  Stop the Robitussin DM (since the Mucinex has the same ingredient, and you aren't coughing during the day). Stop the Nyquil and use the mucinex and Delsym instead.  If your cough persists, go to [facility] [phone]) for a chest x-ray.  This can take potentially 1-2 weeks for them to read the x-ray.  If you are getting worse, have a fever, discolored mucus--please let us know and we can get them to read it faster.  Try and use the laxative less frequently. Work harder on getting more fiber in your diet (see handout)--either through food, or a fiber supplement like Benefiber or Metamucil. Continue to drink plenty of water. Using the dulcolax sparingly.   I spent 30 minutes dedicated to the care of this patient, including pre-visit review of records, face to face time, post-visit ordering of testing and documentation.

## 2023-11-29 ENCOUNTER — Ambulatory Visit (INDEPENDENT_AMBULATORY_CARE_PROVIDER_SITE_OTHER): Payer: Medicare Other | Admitting: Family Medicine

## 2023-11-29 ENCOUNTER — Encounter: Payer: Self-pay | Admitting: Family Medicine

## 2023-11-29 VITALS — BP 116/74 | HR 94 | Temp 97.7°F | Wt 185.2 lb

## 2023-11-29 DIAGNOSIS — K219 Gastro-esophageal reflux disease without esophagitis: Secondary | ICD-10-CM | POA: Diagnosis not present

## 2023-11-29 NOTE — Progress Notes (Signed)
   Subjective:    Patient ID: Joseph West, male    DOB: [birth year], 83 y.o.   MRN: [id number]  HPI He he has been seen recently for both allergies and for a sinus infection.  He states that the sinus symptoms are much better but he still having some postnasal drainage especially at night when he lies down.  He cannot necessarily relate this to food.  He does note that if he raises his head slightly the postnasal drainage symptoms diminished.  No sneezing, itchy watery eyes, rhinorrhea.  When he eats his evening meal it is several hours before bedtime.   Review of Systems     Objective:    Physical Exam Alert and in no distress. Tympanic membranes and canals are normal. Pharyngeal area is normal. Neck is supple without adenopathy or thyromegaly. Cardiac exam shows a regular sinus rhythm without murmurs or gallops. Lungs are clear to auscultation.        Assessment & Plan:  Gastroesophageal reflux disease without esophagitis I explained that the symptoms sound like they could easily be reflux disease and recommend Prilosec regularly for the next week or 2.  He will keep me informed as to his symptoms.  May need to pursue this further both with allergy and reflux symptoms with x-rays and further testing.  He was comfortable with that.

## 2023-11-29 NOTE — Patient Instructions (Signed)
 Take Prilosec regularly for the next week or 2 and see what effect that has on

## 2023-12-17 ENCOUNTER — Other Ambulatory Visit (HOSPITAL_BASED_OUTPATIENT_CLINIC_OR_DEPARTMENT_OTHER): Payer: Self-pay

## 2024-01-17 ENCOUNTER — Encounter: Payer: Self-pay | Admitting: Internal Medicine

## 2024-02-14 ENCOUNTER — Other Ambulatory Visit: Payer: Self-pay | Admitting: Family Medicine

## 2024-02-14 ENCOUNTER — Ambulatory Visit (INDEPENDENT_AMBULATORY_CARE_PROVIDER_SITE_OTHER): Admitting: Medical

## 2024-02-14 VITALS — BP 120/80 | HR 89 | Wt 187.2 lb

## 2024-02-14 DIAGNOSIS — M25561 Pain in right knee: Secondary | ICD-10-CM

## 2024-02-14 DIAGNOSIS — I10 Essential (primary) hypertension: Secondary | ICD-10-CM

## 2024-02-14 DIAGNOSIS — S86819A Strain of other muscle(s) and tendon(s) at lower leg level, unspecified leg, initial encounter: Secondary | ICD-10-CM

## 2024-02-14 MED ORDER — HYDROCODONE-ACETAMINOPHEN 5-325 MG PO TABS: 1.0000 | ORAL_TABLET | Freq: Two times a day (BID) | ORAL | 0 refills | Status: DC | PRN

## 2024-02-14 NOTE — Patient Instructions (Addendum)
 You are inflamed and tender over the lateral knee.  You may have aggravated this playing golf   Recommendation: Rest your legs when possible the next week Use pillow to elevate the knee when resting Use cold therapy such as ice water pack or cold pack 20 minutes, 2-3 times daily for the next several days Use ACE wrap or knee sleeve for compression for the next week when possible You can use short term over the counter Aleve, 1 tablet daily for 5-7 days for pain and inflammation.  Don't use this more than listed due to your kidney function You can alternate using some tylenol over the counter for pain or I prescribed a little stronger pain medicaiton today for worse pain.  The Norco/hydrocodone can cause sleepy, so use this sparingly over the next few days only for worse pain. So in other words, you can do Aleve in the morning, and either Tylenol or the Norco/Hydrocodone twice daily at other times of the day for pain. If worse over the next week, then recheck

## 2024-02-14 NOTE — Progress Notes (Signed)
 Subjective:  Joseph West is a 83 y.o. male who presents for Chief Complaint  Patient presents with   pain    Pain in knee cap x 4-5 days      Here today for pain in right knee.  No injury, no trauma, no fall.  He notes in the past 4 to 5 days he has been having pain over the lateral part of his right knee.  He does play some golf and has been swimming.  He works out regularly at [employer]  The area feels swollen on the right lateral knee.  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  No leg weakness numbness or tingling.  Just pain and a little bit of swelling.  Using ice, rest, wife's knee brace, Hemp relief cream  He is heading up to [place] to go to a funeral.  Going by [facility]  No other aggravating or relieving factors.    No other c/o.  The following portions of the patient's history were reviewed and updated as appropriate: allergies, current medications, past family history, past medical history, past social history, past surgical history and problem list.  ROS Otherwise as in subjective above  Objective: BP 120/80   Pulse 89   Wt 187 lb 3.2 oz (84.9 kg)   BMI 25.39 kg/m   General appearance: alert, no distress, well developed, well nourished Tender over the lateral knee over the biceps femoris insertion lateral fibular on the right with mild swelling. Otherwise knee nontender with no obvious swelling or deformity otherwise.  Rest of leg unremarkable. Legs neurovascularly intact    Assessment: Encounter Diagnoses  Name Primary?   Biceps femoris tendon strain at knee Yes   Lateral knee pain, right      Plan:  You are inflamed and tender over the lateral knee.  You may have aggravated this playing golf   Recommendation: Rest your legs when possible the next week Use pillow to elevate the knee when resting Use cold therapy such as ice water pack or cold pack 20 minutes,  2-3 times daily for the next several days Use ACE wrap or knee sleeve for compression for the next week when possible You can use short term over the counter Aleve, 1 tablet daily for 5-7 days for pain and inflammation.  Don't use this more than listed due to your kidney function You can alternate using some tylenol over the counter for pain or I prescribed a little stronger pain medicaiton today for worse pain.  The Norco/hydrocodone can cause sleepy, so use this sparingly over the next few days only for worse pain. So in other words, you can do Aleve in the morning, and either Tylenol or the Norco/Hydrocodone twice daily at other times of the day for pain. If worse over the next week, then recheck  Brasen was seen today for pain.  Diagnoses and all orders for this visit:  Biceps femoris tendon strain at knee  Lateral knee pain, right  Other orders -     HYDROcodone-acetaminophen (NORCO/VICODIN) 5-325 MG tablet; Take 1 tablet by mouth 2 (two) times daily as needed for moderate pain (pain score 4-6).    Follow up: prn

## 2024-02-28 ENCOUNTER — Encounter: Payer: Self-pay | Admitting: Family Medicine

## 2024-02-28 ENCOUNTER — Ambulatory Visit (INDEPENDENT_AMBULATORY_CARE_PROVIDER_SITE_OTHER): Payer: Medicare HMO | Admitting: Family Medicine

## 2024-02-28 VITALS — BP 130/82 | HR 65 | Ht 71.0 in | Wt 188.2 lb

## 2024-02-28 DIAGNOSIS — I10 Essential (primary) hypertension: Secondary | ICD-10-CM

## 2024-02-28 DIAGNOSIS — I714 Abdominal aortic aneurysm, without rupture, unspecified: Secondary | ICD-10-CM | POA: Diagnosis not present

## 2024-02-28 DIAGNOSIS — Z0000 Encounter for general adult medical examination without abnormal findings: Secondary | ICD-10-CM

## 2024-02-28 DIAGNOSIS — M1990 Unspecified osteoarthritis, unspecified site: Secondary | ICD-10-CM

## 2024-02-28 DIAGNOSIS — E782 Mixed hyperlipidemia: Secondary | ICD-10-CM | POA: Diagnosis not present

## 2024-02-28 DIAGNOSIS — N1831 Chronic kidney disease, stage 3a: Secondary | ICD-10-CM | POA: Diagnosis not present

## 2024-02-28 DIAGNOSIS — K219 Gastro-esophageal reflux disease without esophagitis: Secondary | ICD-10-CM

## 2024-02-28 DIAGNOSIS — F17200 Nicotine dependence, unspecified, uncomplicated: Secondary | ICD-10-CM

## 2024-02-28 DIAGNOSIS — J301 Allergic rhinitis due to pollen: Secondary | ICD-10-CM

## 2024-02-28 DIAGNOSIS — Z23 Encounter for immunization: Secondary | ICD-10-CM | POA: Diagnosis not present

## 2024-02-28 MED ORDER — PRAVASTATIN SODIUM 20 MG PO TABS: 20.0000 mg | ORAL_TABLET | Freq: Every day | ORAL | 3 refills | Status: DC

## 2024-02-28 MED ORDER — HYDROCHLOROTHIAZIDE 12.5 MG PO CAPS: ORAL_CAPSULE | ORAL | 3 refills | Status: DC

## 2024-02-28 NOTE — Progress Notes (Signed)
 Joseph West is a 83 y.o. male who presents for a CPE and follow-up on chronic medical conditions.  He does have some difficulty with arthritis but seems to be handling this quite well.  His allergies seem to be under good control.  He does smoke but is now down to 4 cigarettes/week.  Does have history of AAA and is scheduled for follow-up visit next year.  He is not drink.  He keeps himself very physically active with exercise and and playing golf.   Immunizations and Health Maintenance Immunization History  Administered Date(s) Administered   Fluad Quad(high Dose 65+) 09/04/2019, 10/06/2020, 08/25/2021, 08/27/2022, 07/20/2023   Influenza, High Dose Seasonal PF 09/04/2013, 08/28/2015, 10/08/2016, 09/06/2017, 10/05/2017   Influenza-Unspecified 08/08/2018, 10/09/2019, 06/22/2021   Moderna Covid-19 Vaccine Bivalent Booster 31yrs & up 08/25/2021   Moderna Sars-Covid-2 Vaccination 12/14/2019, 01/18/2020, 09/18/2020   PNEUMOCOCCAL CONJUGATE-20 02/28/2024   Pfizer(Comirnaty)Fall Seasonal Vaccine 12 years and older 09/28/2022   Pneumococcal Conjugate-13 08/28/2015, 09/07/2017   Pneumococcal Polysaccharide-23 03/25/2006   Td 11/22/2000   Tdap 01/30/2013, 06/02/2018   Unspecified SARS-COV-2 Vaccination 07/20/2023   Zoster Recombinant(Shingrix) 06/02/2018, 08/08/2018   Zoster, Live 05/10/2006   Health Maintenance Due  Topic Date Due   COVID-19 Vaccine (8 - 2024-25 season) 01/20/2024    Last colonoscopy: 12/26/2018 Last PSA:  Dentist: 2023 does not remember  Ophtho: 2025  Exercise: goes swimming at the [facility] 3 days a week and plays golf   Other doctors caring for patient include: Goes to the [place] goes back in July for physical  Advanced Directives:need a copy Does Patient Have a Medical Advance Directive?: Yes Type of Advance Directive: Healthcare Power of [place], Living will Does patient want to make changes to medical advance directive?: Yes (Inpatient - patient defers changing a medical  advance directive at this time - Information given) Copy of Healthcare Power of Attorney in Chart?: Yes - validated most recent copy scanned in chart (See row information)  Depression screen:  See questionnaire below.        02/28/2024    9:40 AM 08/30/2023    2:07 PM 08/27/2022    2:27 PM 02/17/2022    9:31 AM 02/12/2021    9:46 AM  Depression screen PHQ 2/9  Decreased Interest 0 0 0 0 0  Down, Depressed, Hopeless 0 0 0 0 0  PHQ - 2 Score 0 0 0 0 0  Altered sleeping  0 0    Tired, decreased energy  0 0    Change in appetite  0 0    Feeling bad or failure about yourself   0 0    Trouble concentrating  0 0    Moving slowly or fidgety/restless  0 0    Suicidal thoughts  0 0    PHQ-9 Score  0 0    Difficult doing work/chores  Not difficult at all Not difficult at all      Fall Screen: See Questionaire below.      02/28/2024    9:41 AM 08/30/2023    2:06 PM 02/22/2023    9:26 AM 02/10/2023    9:57 AM 08/27/2022    2:26 PM  Fall Risk   Falls in the past year?  0 0 0 0  Number falls in past yr: 0 0 0 0 0  Injury with Fall? 0 0 0 0 0  Risk for fall due to : No Fall Risks Medication side effect No Fall Risks No Fall Risks Medication side effect  Follow up Falls evaluation completed Falls prevention discussed;Falls evaluation completed Falls evaluation completed Falls evaluation completed Falls prevention discussed;Education provided;Falls evaluation completed    ADL screen:  See questionnaire below.  Functional Status Survey: Is the patient deaf or have difficulty hearing?: No Does the patient have difficulty seeing, even when wearing glasses/contacts?: Yes (has glasses getting fixed) Does the patient have difficulty concentrating, remembering, or making decisions?: No Does the patient have difficulty walking or climbing stairs?: No Does the patient have difficulty dressing or bathing?: No Does the patient have difficulty doing errands alone such as visiting a doctor's office or  shopping?: No   Review of Systems  Constitutional: -, -unexpected weight change, -anorexia, -fatigue Allergy: -sneezing, -itching, -congestion Dermatology: denies changing moles, rash, lumps ENT: -runny nose, -ear pain, -sore throat,  Cardiology:  -chest pain, -palpitations, -orthopnea, Respiratory: -cough, -shortness of breath, -dyspnea on exertion, -wheezing,  Gastroenterology: -abdominal pain, -nausea, -vomiting, -diarrhea, -constipation, -dysphagia Hematology: -bleeding or bruising problems Musculoskeletal: -arthralgias, -myalgias, -joint swelling, -back pain, - Ophthalmology: -vision changes,  Urology: -dysuria, -difficulty urinating,  -urinary frequency, -urgency, incontinence Neurology: -, -numbness, , -memory loss, -falls, -dizziness    PHYSICAL EXAM:  BP 130/82   Pulse 65   Ht 5\' 11"  (1.803 m)   Wt 188 lb 3.2 oz (85.4 kg)   SpO2 97%   BMI 26.25 kg/m   General Appearance: Alert, cooperative, no distress, appears stated age Head: Normocephalic, without obvious abnormality, atraumatic Eyes: PERRL, conjunctiva/corneas clear, EOM's intact,  Ears: Normal TM's and external ear canals Nose: Nares normal, mucosa normal, no drainage or sinus   tenderness Throat: Lips, mucosa, and tongue normal; teeth and gums normal Neck: Supple, no lymphadenopathy, thyroid:no enlargement/tenderness/nodules; no carotid bruit or JVD Lungs: Clear to auscultation bilaterally without wheezes, rales or ronchi; respirations unlabored Heart: Regular rate and rhythm, S1 and S2 normal, no murmur, rub or gallop Abdomen: Soft, non-tender, nondistended, normoactive bowel sounds, no masses, no hepatosplenomegaly Extremities: No clubbing, cyanosis or edema Pulses: 2+ and symmetric all extremities Skin: Skin color, texture, turgor normal, no rashes or lesions Lymph nodes: Cervical, supraclavicular, and axillary nodes normal Neurologic: CNII-XII intact, normal strength, sensation and gait; reflexes 2+ and  symmetric throughout   Psych: Normal mood, affect, hygiene and grooming  ASSESSMENT/PLAN: Routine general medical examination at a health care facility  Abdominal aortic aneurysm (AAA), unspecified part, unspecified whether ruptured (HCC)  Arthritis  Gastroesophageal reflux disease without esophagitis - Plan: CBC with Differential/Platelet, Comprehensive metabolic panel with GFR  Mixed hyperlipidemia - Plan: Lipid panel, pravastatin (PRAVACHOL) 20 MG tablet  Seasonal allergic rhinitis due to pollen  Smoker  Stage 3a chronic kidney disease (HCC) - Plan: Comprehensive metabolic panel with GFR  Need for vaccination against Streptococcus pneumoniae - Plan: Pneumococcal conjugate vaccine 20-valent (Prevnar 20)  Essential hypertension - Plan: hydrochlorothiazide (MICROZIDE) 12.5 MG capsule     Immunization recommendations discussed.  Colonoscopy recommendations reviewed.   Medicare Attestation I have personally reviewed: The patient's medical and social history Their use of alcohol, tobacco or illicit drugs Their current medications and supplements The patient's functional ability including ADLs,fall risks, home safety risks, cognitive, and hearing and visual impairment Diet and physical activities Evidence for depression or mood disorders  The patient's weight, height, and BMI have been recorded in the chart.  I have made referrals, counseling, and provided education to the patient based on review of the above and I have provided the patient with a written personalized care plan for preventive services.  Sharlot Gowda, MD   02/28/2024

## 2024-02-29 ENCOUNTER — Encounter: Payer: Self-pay | Admitting: Family Medicine

## 2024-02-29 LAB — COMPREHENSIVE METABOLIC PANEL WITH GFR
ALT: 11 IU/L (ref 0–44)
AST: 23 IU/L (ref 0–40)
Albumin: 4.1 g/dL (ref 3.7–4.7)
Alkaline Phosphatase: 101 IU/L (ref 44–121)
BUN/Creatinine Ratio: 13 (ref 10–24)
BUN: 19 mg/dL (ref 8–27)
Bilirubin Total: <0.2 mg/dL (ref 0.0–1.2)
CO2: 22 mmol/L (ref 20–29)
Calcium: 9.8 mg/dL (ref 8.6–10.2)
Chloride: 105 mmol/L (ref 96–106)
Creatinine, Ser: 1.52 mg/dL — ABNORMAL HIGH (ref 0.76–1.27)
Globulin, Total: 3.3 g/dL (ref 1.5–4.5)
Glucose: 97 mg/dL (ref 70–99)
Potassium: 4.4 mmol/L (ref 3.5–5.2)
Sodium: 140 mmol/L (ref 134–144)
Total Protein: 7.4 g/dL (ref 6.0–8.5)
eGFR: 45 mL/min/{1.73_m2} — ABNORMAL LOW (ref >59)

## 2024-02-29 LAB — CBC WITH DIFFERENTIAL/PLATELET
Basophils Absolute: 0.1 10*3/uL (ref 0.0–0.2)
Basos: 1 %
EOS (ABSOLUTE): 0.5 10*3/uL — ABNORMAL HIGH (ref 0.0–0.4)
Eos: 7 %
Hematocrit: 44.4 % (ref 37.5–51.0)
Hemoglobin: 14.2 g/dL (ref 13.0–17.7)
Immature Grans (Abs): 0 10*3/uL (ref 0.0–0.1)
Immature Granulocytes: 0 %
Lymphocytes Absolute: 3.4 10*3/uL — ABNORMAL HIGH (ref 0.7–3.1)
Lymphs: 52 %
MCH: 28.2 pg (ref 26.6–33.0)
MCHC: 32 g/dL (ref 31.5–35.7)
MCV: 88 fL (ref 79–97)
Monocytes Absolute: 0.4 10*3/uL (ref 0.1–0.9)
Monocytes: 6 %
Neutrophils Absolute: 2.3 10*3/uL (ref 1.4–7.0)
Neutrophils: 34 %
Platelets: 271 10*3/uL (ref 150–450)
RBC: 5.03 x10E6/uL (ref 4.14–5.80)
RDW: 15.8 % — ABNORMAL HIGH (ref 11.6–15.4)
WBC: 6.7 10*3/uL (ref 3.4–10.8)

## 2024-02-29 LAB — LIPID PANEL
Chol/HDL Ratio: 3.1 ratio (ref 0.0–5.0)
Cholesterol, Total: 190 mg/dL (ref 100–199)
HDL: 62 mg/dL (ref >39)
LDL Chol Calc (NIH): 110 mg/dL — ABNORMAL HIGH (ref 0–99)
Triglycerides: 98 mg/dL (ref 0–149)
VLDL Cholesterol Cal: 18 mg/dL (ref 5–40)

## 2024-02-29 LAB — SPECIMEN STATUS REPORT

## 2024-02-29 MED ORDER — ROSUVASTATIN CALCIUM 20 MG PO TABS: 20.0000 mg | ORAL_TABLET | Freq: Every day | ORAL | 3 refills | Status: AC

## 2024-02-29 NOTE — Addendum Note (Signed)
 Addended by: Ronnald Nian on: 02/29/2024 10:23 AM   Modules accepted: Orders

## 2024-06-13 ENCOUNTER — Ambulatory Visit: Admitting: Medical

## 2024-06-13 VITALS — BP 120/70 | HR 71 | Temp 97.3°F | Wt 177.6 lb

## 2024-06-13 DIAGNOSIS — R051 Acute cough: Secondary | ICD-10-CM | POA: Diagnosis not present

## 2024-06-13 DIAGNOSIS — G8929 Other chronic pain: Secondary | ICD-10-CM

## 2024-06-13 DIAGNOSIS — M25561 Pain in right knee: Secondary | ICD-10-CM

## 2024-06-13 DIAGNOSIS — J0110 Acute frontal sinusitis, unspecified: Secondary | ICD-10-CM

## 2024-06-13 DIAGNOSIS — R0982 Postnasal drip: Secondary | ICD-10-CM | POA: Diagnosis not present

## 2024-06-13 MED ORDER — AMOXICILLIN 875 MG PO TABS: 875.0000 mg | ORAL_TABLET | Freq: Two times a day (BID) | ORAL | 0 refills | Status: AC

## 2024-06-13 MED ORDER — BENZONATATE 100 MG PO CAPS: 100.0000 mg | ORAL_CAPSULE | Freq: Two times a day (BID) | ORAL | 0 refills | Status: DC | PRN

## 2024-06-13 MED ORDER — GUAIFENESIN ER 600 MG PO TB12
600.0000 mg | ORAL_TABLET | Freq: Two times a day (BID) | ORAL | 0 refills | Status: AC
Start: 2024-06-13 — End: ?

## 2024-06-13 NOTE — Progress Notes (Signed)
 Subjective:  Joseph West is a 83 y.o. male who presents for Chief Complaint  Patient presents with   Acute Visit    Phelgm in chest, and laying down at night makes him cough, not sure if its sinuses going on.  Having knee pain as well and would like a handicap placard- doesn't go back to [place] until December     Here for cough, phelgm in chest, sinus congestion and pressure in sinuses.  Goes to gym and lately in steam room, mucous will draina out nostrils.  Been going on 2 weeks.  No sore throat no fever, no body ahces or chills.   No blood in musocu.  Using nothing for sympotms.  No shortness of breath or wheezing.  No chest pain.  He has ongoing problems with his right knee.  He was seen here in March for similar.  He has had an x-ray of the knee in the past through the [place] system that is in several years.  He continues to have fairly regular pain, some swelling.  Pain aggravates sometimes and he tries to play golf which he enjoys doing.  Using knee sleeve.  Using hemp based cream.  No other aggravating or relieving factors.    No other c/o.  The following portions of the patient's history were reviewed and updated as appropriate: allergies, current medications, past family history, past medical history, past social history, past surgical history and problem list.  ROS Otherwise as in subjective above  Objective: BP 120/70   Pulse 71   Temp (!) 97.3 F (36.3 C)   Wt 177 lb 9.6 oz (80.6 kg)   SpO2 98%   BMI 24.77 kg/m   Wt Readings from Last 3 Encounters:  06/13/24 177 lb 9.6 oz (80.6 kg)  02/28/24 188 lb 3.2 oz (85.4 kg)  02/14/24 187 lb 3.2 oz (84.9 kg)    General appearance: alert, no distress, well developed, well nourished HEENT: normocephalic, sclerae anicteric, conjunctiva pink and moist, TMs pearly, nares with some mucoid discharge, +mild erythema, pharynx normal Oral cavity: MMM, no lesions Neck: supple, no lymphadenopathy, no thyromegaly, no masses Heart: RRR,  normal S1, S2, no murmurs Lungs: CTA bilaterally, no wheezes, rhonchi, or rales MSK: Tender over the right knee lateral joint line, mild pain with range of motion and McMurry test, mild puffy swelling of the lateral joint space, otherwise knee nontender.  Bony arthritic findings noted.  Rest of the leg unremarkable Leg neurovascularly intact Pulses: 2+ radial pulses, 2+ pedal pulses, normal cap refill Ext: no edema   Assessment: Encounter Diagnoses  Name Primary?   Chronic pain of right knee Yes   Acute non-recurrent frontal sinusitis    Acute cough    Post-nasal drainage      Plan: Chronic knee pain  Please go to [facility] for your right knee xray.   Their hours are 8am - 4:30 pm Monday - Friday.  Take your insurance card with you.  [facility] [phone]   [address] [place], [place] [address]   You can use your knee sleeve and cold therapy, ice therapy as needed.  You can continue the cream you are using topically.  You can use Tylenol  over-the-counter for pain once or twice daily.   Sinus infection, cough, drainage Begin Mucinex  twice daily for 5 days for mucus Begin amoxicillin  antibiotic You can use Tessalon  Perles as needed for cough You can use salt water gargles for mucus in the back of the throat If  not much improved within the next week let me know   We completed handicap placard for you today.  [name] was seen today for acute visit.  Diagnoses and all orders for this visit:  Chronic pain of right knee -     DG Knee Complete 4 Views Right; Future  Acute non-recurrent frontal sinusitis  Acute cough  Post-nasal drainage  Other orders -     amoxicillin  (AMOXIL ) 875 MG tablet; Take 1 tablet (875 mg total) by mouth 2 (two) times daily for 10 days. -     benzonatate  (TESSALON ) 100 MG capsule; Take 1 capsule (100 mg total) by mouth 2 (two) times daily as needed for cough. -     guaiFENesin  (MUCINEX ) 600 MG 12 hr tablet; Take 1  tablet (600 mg total) by mouth 2 (two) times daily.    Follow up: pending xray

## 2024-06-13 NOTE — Patient Instructions (Addendum)
 Chronic knee pain  Please go to [facility] for your right knee xray.   Their hours are 8am - 4:30 pm Monday - Friday.  Take your insurance card with you.  [facility] [phone]   [address] [place], [place] [address]   You can use your knee sleeve and cold therapy, ice therapy as needed.  You can continue the cream you are using topically.  You can use Tylenol  over-the-counter for pain once or twice daily.   Sinus infection, cough, drainage Begin Mucinex  twice daily for 5 days for mucus Begin amoxicillin  antibiotic You can use Tessalon  Perles as needed for cough You can use salt water gargles for mucus in the back of the throat If not much improved within the next week let me know   We completed handicap placard for you today.

## 2024-06-18 ENCOUNTER — Ambulatory Visit
Admission: RE | Admit: 2024-06-18 | Discharge: 2024-06-18 | Disposition: A | Discharge status: Home / Self Care | Source: Ambulatory Visit | Attending: Medical | Admitting: Medical

## 2024-06-18 DIAGNOSIS — G8929 Other chronic pain: Secondary | ICD-10-CM

## 2024-06-18 DIAGNOSIS — M11261 Other chondrocalcinosis, right knee: Secondary | ICD-10-CM | POA: Diagnosis not present

## 2024-06-25 ENCOUNTER — Ambulatory Visit: Payer: Self-pay | Admitting: Medical

## 2024-06-25 NOTE — Progress Notes (Signed)
 He has some degenerative changes of the right knee and a little bit of fluid in the knee.  He can certainly use cold therapy, rest, topical over-the-counter Aspercreme or Voltaren  gel when needed for pain.  He can use oral Tylenol  for pain.  I would limit or avoid anti-inflammatory such as ibuprofen given his kidney marker.  He may want to come back and talk to Dr. Vita here who deals with sports medicine and orthopedics about other potential therapy such as steroid injection into the knee which could help reduce some inflammation and give some relief

## 2024-06-26 ENCOUNTER — Encounter: Payer: Self-pay | Admitting: Family Medicine

## 2024-06-26 ENCOUNTER — Ambulatory Visit: Admitting: Family Medicine

## 2024-06-26 VITALS — BP 128/90 | HR 82 | Wt 176.6 lb

## 2024-06-26 DIAGNOSIS — M25561 Pain in right knee: Secondary | ICD-10-CM | POA: Diagnosis not present

## 2024-06-26 DIAGNOSIS — G8929 Other chronic pain: Secondary | ICD-10-CM | POA: Diagnosis not present

## 2024-06-26 MED ORDER — BUPIVACAINE HCL (PF) 0.25 % IJ SOLN
2.0000 mL | Freq: Once | INTRAMUSCULAR | Status: AC
  Administered 2024-06-26: 2 mL

## 2024-06-26 MED ORDER — LIDOCAINE HCL 1 % IJ SOLN
5.0000 mL | Freq: Once | INTRAMUSCULAR | Status: AC
  Administered 2024-06-26: 5 mL via INTRADERMAL

## 2024-06-26 MED ORDER — BUPIVACAINE HCL 0.25 % IJ SOLN: 2.0000 mL | Freq: Once | INTRAMUSCULAR | Status: DC

## 2024-06-26 MED ORDER — METHYLPREDNISOLONE ACETATE 40 MG/ML IJ SUSP
40.0000 mg | Freq: Once | INTRAMUSCULAR | Status: AC
Start: 2024-06-26 — End: 2024-06-26
  Administered 2024-06-26: 40 mg via INTRAMUSCULAR

## 2024-06-26 NOTE — Addendum Note (Signed)
 Addended by: LATTIE CARLO BROCKS on: 06/26/2024 04:27 PM   Modules accepted: Orders

## 2024-06-26 NOTE — Progress Notes (Addendum)
 Name: Joseph West   Date of Visit: 06/26/24   Date of last visit with me: Visit date not found   CHIEF COMPLAINT:  Chief Complaint  Patient presents with   Acute Visit    Knee pain, possible injection. Had xray, already seen [facility] for knee issues. Worse in the mornings. Also have pain in hip in neck. Been wearing brace on knee.        HPI:  Discussed the use of AI scribe software for clinical note transcription with the patient, who gave verbal consent to proceed.  History of Present Illness   Joseph West is an 83 year old male with arthritis who presents with worsening knee pain.  He experiences knee pain primarily in the right knee, although both knees are affected. The pain has worsened over the past few days and is exacerbated by activities such as walking, playing golf, and grocery shopping, sometimes requiring him to stop due to discomfort.  He wears a knee brace, which provides some relief by generating heat. He engages in swimming and gym activities to maintain physical activity. He has a history of fluid being removed from his knee about six years ago, without any subsequent issues until recently.  His past medical history includes arthritis, and he has been active in sports and [employer]. 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  He has not had any prior steroid injections for his knee pain.         OBJECTIVE:   BP (!) 128/90   Pulse 82   Wt 176 lb 9.6 oz (80.1 kg)   SpO2 99%   BMI 24.63 kg/m   Inspection reveals some mild swelling of the right knee compared to the left.  There is no significant amount of tenderness to palpation over the medial lateral joint lines.  Mild crepitus noted of the patella with flexion extension.  Range of motion flexion extension is full.  McMurray negative.  Strength is 5 out of 5. Ortho Exam   IMAGING:  Previous x-rays on 06/18/2024 reviewed independently by me.   There is some mild degenerative joint space degradation of the right knee as well as some spurring off the tibial plateau as well as some sclerosis of the bilateral tibial plateaus.  There is noted patellofemoral arthritic change.  Noted lateral tibial plateau calcification  ASSESSMENT/PLAN:   Assessment & Plan    Assessment and Plan    Right knee osteoarthritis with effusion Chronic osteoarthritis with acute pain exacerbation due to effusion. X-rays show age-appropriate joint space. Good quadriceps strength maintained. Goal to avoid surgery. - Perform aspiration of the right knee to remove effusion fluid.  Successfully aspirated approximately 20 cc of serosanguineous fluid - Administer corticosteroid injection into the right knee. - Advise continued use of knee compression sleeve during activities. - Recommend continuation of gym exercises and icing the knee at night. - Discuss potential future use of gel injections if steroid injections are ineffective.  Left knee osteoarthritis Chronic osteoarthritis. Good joint function maintained through physical activity. - Advise continued physical activity and muscle strengthening exercises.      PROCEDURE:  Risks & benefits of right knee aspiration & cortisone injection reviewed. Consent obtained. Time-out completed. Patient prepped and draped in the normal fashion. Area cleansed with chlorhexidene. Ethyl chloride spray used to anesthetize the skin. Solution of 3 mL 1% lidocaine  injected into superior lateral aspect of  knee for local anesthesia.  After ensuring  adequate anesthesia an 18-g 1.5-inch needle on 60-mL syringe inserted into right knee via superior-lateral approach.  Aspirated 20 cc of yellow synovial fluid - was not sent for analysis.  Needle left in place, syringe exchanged for syringe with solution of 4 mL 1% lidocaine  with 1 mL methylprednisolone  (Depo-medrol ) 40mg /mL.  This was injected into the right  knee without complication. Patient  tolerated procedure well without any complications. Area covered with adhesive bandage. Post-procedure care reviewed.  All questions answered.    Joseph Vadala A. Vita MD [facility]

## 2024-07-26 ENCOUNTER — Encounter: Payer: Self-pay | Admitting: Family Medicine

## 2024-07-26 ENCOUNTER — Ambulatory Visit (INDEPENDENT_AMBULATORY_CARE_PROVIDER_SITE_OTHER): Admitting: Family Medicine

## 2024-07-26 VITALS — BP 130/78 | HR 57 | Wt 179.0 lb

## 2024-07-26 DIAGNOSIS — I509 Heart failure, unspecified: Secondary | ICD-10-CM

## 2024-07-26 DIAGNOSIS — R0602 Shortness of breath: Secondary | ICD-10-CM

## 2024-07-26 NOTE — Progress Notes (Signed)
   Name: Joseph West   Date of Visit: 07/26/24   Date of last visit with me: 06/26/2024   CHIEF COMPLAINT:  Chief Complaint  Patient presents with   Acute Visit    Congestion in chest.        HPI:  Discussed the use of AI scribe software for clinical note transcription with the patient, who gave verbal consent to proceed.  History of Present Illness   Joseph West is an 83 year old male who presents with a month-long history of congestion and cough. He is accompanied by his wife.  He has been experiencing persistent congestion and an irritating cough for almost a month, with occasional production of a small amount of phlegm. Symptoms worsen at night when lying flat. No fever or chills are present.  No weakness or fatigue. He does not take any diuretics or water pills.  He recalls a previous knee procedure that improved his mobility and golf swing. He uses a patch for soreness and drinks mineral water, though he has not noticed significant changes from the latter.  No fever, chills, or fatigue. No wheezing.         OBJECTIVE:       02/28/2024    9:40 AM  Depression screen PHQ 2/9  Decreased Interest 0  Down, Depressed, Hopeless 0  PHQ - 2 Score 0     BP Readings from Last 3 Encounters:  07/26/24 130/78  06/26/24 (!) 128/90  06/13/24 120/70    BP 130/78   Pulse (!) 57   Wt 179 lb (81.2 kg)   SpO2 99%   BMI 24.97 kg/m      Physical Exam Cardiovascular:     Rate and Rhythm: Normal rate and regular rhythm.  Pulmonary:     Effort: No respiratory distress.     Breath sounds: No stridor. Rales present. No wheezing or rhonchi.  Chest:     Chest wall: No tenderness.     ASSESSMENT/PLAN:   Assessment & Plan SOB (shortness of breath)  Chronic congestive heart failure, unspecified heart failure type (HCC)    Assessment and Plan    Suspected Heart failure with pulmonary congestion Chronic heart failure with suspected pulmonary congestion. Differential  diagnosis less likely includes infection due to chronic symptoms and lack of systemic signs. - Order chest x-ray to assess for pulmonary congestion. - Consider initiating diuretics (Lasix) if x-ray confirms fluid overload.  Shortness of breath Shortness of breath likely secondary to pulmonary congestion from heart failure. - Evaluate chest x-ray results to confirm pulmonary congestion. - Initiate diuretic therapy if pulmonary congestion is confirmed.         Pauleen Goleman A. Vita MD [facility]

## 2024-07-27 ENCOUNTER — Ambulatory Visit
Admission: RE | Admit: 2024-07-27 | Discharge: 2024-07-27 | Disposition: A | Discharge status: Home / Self Care | Source: Ambulatory Visit | Attending: Family Medicine | Admitting: Family Medicine

## 2024-07-27 DIAGNOSIS — R0602 Shortness of breath: Secondary | ICD-10-CM | POA: Diagnosis not present

## 2024-07-27 DIAGNOSIS — I509 Heart failure, unspecified: Secondary | ICD-10-CM

## 2024-08-01 ENCOUNTER — Ambulatory Visit: Payer: Self-pay | Admitting: Family Medicine

## 2024-08-06 ENCOUNTER — Encounter: Payer: Self-pay | Admitting: Family Medicine

## 2024-08-06 ENCOUNTER — Ambulatory Visit (INDEPENDENT_AMBULATORY_CARE_PROVIDER_SITE_OTHER): Admitting: Family Medicine

## 2024-08-06 VITALS — BP 128/84 | HR 71 | Wt 182.2 lb

## 2024-08-06 DIAGNOSIS — M25559 Pain in unspecified hip: Secondary | ICD-10-CM | POA: Diagnosis not present

## 2024-08-06 DIAGNOSIS — G8929 Other chronic pain: Secondary | ICD-10-CM

## 2024-08-06 DIAGNOSIS — J449 Chronic obstructive pulmonary disease, unspecified: Secondary | ICD-10-CM | POA: Diagnosis not present

## 2024-08-06 DIAGNOSIS — M25511 Pain in right shoulder: Secondary | ICD-10-CM

## 2024-08-06 DIAGNOSIS — M25512 Pain in left shoulder: Secondary | ICD-10-CM | POA: Diagnosis not present

## 2024-08-06 MED ORDER — SPIRIVA RESPIMAT 2.5 MCG/ACT IN AERS: 2.0000 | INHALATION_SPRAY | Freq: Every day | RESPIRATORY_TRACT | 1 refills | Status: DC

## 2024-08-06 NOTE — Progress Notes (Signed)
 Name: Joseph West   Date of Visit: 08/06/24   Date of last visit with me: 07/26/2024   CHIEF COMPLAINT:  Chief Complaint  Patient presents with   Follow-up    Discuss xray results.        HPI:  Discussed the use of AI scribe software for clinical note transcription with the patient, who gave verbal consent to proceed.  History of Present Illness   Joseph West is an 83 year old male who presents with persistent chest tightness and respiratory concerns.  He experiences persistent chest tightness and respiratory issues, which he attributes to his history of smoking. Although he has reduced smoking, he has not completely quit. The chest tightness is constant but does not significantly impact his daily activities. He experiences wheezing, particularly during exertion, such as walking long distances. He recalls an episode where he had to stop multiple times due to discomfort while walking to a football game.  He recently took a steroid medication, which alleviated some symptoms. He has not had significant symptoms requiring immediate intervention. He has not undergone regular CT scans or pulmonary function tests, and there is no record of a recent CT scan in the current medical system.  He engages in regular physical activity, including swimming and exercises at the [facility], such as jumping jacks and pull-ups in the water. He reports no issues with dizziness or discomfort after these activities. He experiences hip pain when walking long distances, which he manages with exercises. No issues with sleeping on his side.  He has arthritis in his shoulders, which occasionally flares up. He manages it with exercises and has previously received injections for his knee, which he found beneficial. He uses a brace for support and applies ice as needed.         OBJECTIVE:       02/28/2024    9:40 AM  Depression screen PHQ 2/9  Decreased Interest 0  Down, Depressed, Hopeless 0  PHQ - 2 Score 0      BP Readings from Last 3 Encounters:  08/06/24 128/84  07/26/24 130/78  06/26/24 (!) 128/90    BP 128/84   Pulse 71   Wt 182 lb 3.2 oz (82.6 kg)   SpO2 98%   BMI 25.41 kg/m    Physical Exam   CHEST: Wheezing present on auscultation.      Physical Exam Constitutional:      Appearance: Normal appearance.  Neurological:     General: No focal deficit present.     Mental Status: He is alert and oriented to person, place, and time. Mental status is at baseline.     ASSESSMENT/PLAN:   Assessment & Plan COPD, moderate (HCC)  Greater trochanteric pain syndrome  Chronic pain of both shoulders    Assessment and Plan    Chronic obstructive pulmonary disease (COPD) Findings consistent with COPD, likely due to age-related changes and smoking history. Chest x-ray showed haziness, indicating reduced lung function. Persistent chest tightness manageable. Viral illnesses may have severe impact due to compromised lung function. - Order CT scan of the chest for further evaluation. - Schedule pulmonary function testing. - Refer to pulmonologist. - Initiate mild daily inhaler, two puffs daily. - Advise continuation of current physical activities and lifestyle.  Greater trochanteric pain syndrome Pain in the hip area after walking long distances, attributed to greater trochanteric pain syndrome due to muscle weakness. - Provide exercises to strengthen walking muscles[facility]. -  Advise continuation of current physical activities, including swimming and exercises in the pool.  Right shoulder osteoarthritis with pain Right shoulder osteoarthritis causing pain. Managed with exercises and occasional brace use. - Order x-rays of the shoulders. - Consider corticosteroid injection for shoulder pain if it flares up.         Cali Cuartas A. Vita MD [facility]

## 2024-09-11 ENCOUNTER — Encounter: Payer: Self-pay | Admitting: Family Medicine

## 2024-09-11 ENCOUNTER — Ambulatory Visit: Payer: Self-pay | Admitting: Family Medicine

## 2024-09-11 ENCOUNTER — Ambulatory Visit (INDEPENDENT_AMBULATORY_CARE_PROVIDER_SITE_OTHER): Admitting: Family Medicine

## 2024-09-11 ENCOUNTER — Ambulatory Visit: Payer: Medicare HMO

## 2024-09-11 VITALS — BP 122/64 | HR 90 | Temp 98.3°F | Ht 71.0 in | Wt 178.8 lb

## 2024-09-11 VITALS — BP 130/76 | HR 76 | Ht 71.0 in | Wt 175.8 lb

## 2024-09-11 DIAGNOSIS — I10 Essential (primary) hypertension: Secondary | ICD-10-CM | POA: Diagnosis not present

## 2024-09-11 DIAGNOSIS — Z0000 Encounter for general adult medical examination without abnormal findings: Secondary | ICD-10-CM | POA: Diagnosis not present

## 2024-09-11 DIAGNOSIS — R1319 Other dysphagia: Secondary | ICD-10-CM

## 2024-09-11 LAB — BASIC METABOLIC PANEL WITH GFR
BUN/Creatinine Ratio: 11 (ref 10–24)
BUN: 18 mg/dL (ref 8–27)
CO2: 28 mmol/L (ref 20–29)
Calcium: 10.5 mg/dL — ABNORMAL HIGH (ref 8.6–10.2)
Chloride: 104 mmol/L (ref 96–106)
Creatinine, Ser: 1.7 mg/dL — ABNORMAL HIGH (ref 0.76–1.27)
Glucose: 95 mg/dL (ref 70–99)
Potassium: 4.3 mmol/L (ref 3.5–5.2)
Sodium: 143 mmol/L (ref 134–144)
eGFR: 39 mL/min/1.73 — ABNORMAL LOW (ref >59)

## 2024-09-11 LAB — CBC WITH DIFFERENTIAL/PLATELET
Basophils Absolute: 0 x10E3/uL (ref 0.0–0.2)
Basos: 0 %
EOS (ABSOLUTE): 0.3 x10E3/uL (ref 0.0–0.4)
Eos: 3 %
Hematocrit: 44 % (ref 37.5–51.0)
Hemoglobin: 14.7 g/dL (ref 13.0–17.7)
Lymphocytes Absolute: 3.2 x10E3/uL — ABNORMAL HIGH (ref 0.7–3.1)
Lymphs: 38 %
MCH: 28.8 pg (ref 26.6–33.0)
MCHC: 33.4 g/dL (ref 31.5–35.7)
MCV: 86 fL (ref 79–97)
Monocytes Absolute: 0.7 x10E3/uL (ref 0.1–0.9)
Monocytes: 8 %
Neutrophils Absolute: 4.2 x10E3/uL (ref 1.4–7.0)
Neutrophils: 51 %
Platelets: 257 x10E3/uL (ref 150–450)
RBC: 5.11 x10E6/uL (ref 4.14–5.80)
RDW: 15.6 % — ABNORMAL HIGH (ref 11.6–15.4)
WBC: 8.4 x10E3/uL (ref 3.4–10.8)

## 2024-09-11 MED ORDER — HYDROCHLOROTHIAZIDE 12.5 MG PO CAPS: ORAL_CAPSULE | ORAL | 3 refills | Status: AC

## 2024-09-11 NOTE — Progress Notes (Signed)
   Subjective:    Patient ID: Joseph West, male    DOB: 1941-09-16, 83 y.o.   MRN: [id number]  Discussed the use of AI scribe software for clinical note transcription with the patient, who gave verbal consent to proceed.  History of Present Illness   Joseph West is an 83 year old male who presents with difficulty swallowing and inability to keep down food or liquids.  His symptoms began ten days ago after eating an oatmeal cookie and drinking water while playing golf. Upon returning home, he attempted to eat a meal consisting of okra, cabbage, and pork chop, but experienced food getting 'stuck' and coming back up.  Since the onset of symptoms, he has been unable to keep down both solids and liquids, including water and Gatorade. He describes a sensation of food and liquid getting stuck mid chest area and then regurgitating. Even when some liquid goes down, it eventually comes back up, accompanied by a 'nervous feeling' in his chest.  He has not eaten any solid food for almost a week and has been unable to keep liquids down. Despite attempts to drink water daily, he experiences regurgitation shortly after ingestion.  He mentions a weight loss from 186 pounds two weeks ago to 180 pounds currently on his home scales         Review of Systems     Objective:    Physical Exam Postural signs do show a change in pulse.  Urinalysis shows a specific gravity of 1.010.            Assessment & Plan:  Assessment and Plan    Dysphagia with vomiting and inability to tolerate solids or liquids Acute dysphagia with vomiting, unable to tolerate solids or liquids, raising concern for esophageal obstruction or motility disorder. Notable weight loss observed. - Check blood pressure and pulse lying down and standing. - Instructed him to weigh himself at home using the same scale and clothing, report weight via MyChart.

## 2024-09-11 NOTE — Patient Instructions (Signed)
 [name],  Thank you for taking the time for your Medicare Wellness Visit. I appreciate your continued commitment to your health goals. Please review the care plan we discussed, and feel free to reach out if I can assist you further.  Medicare recommends these wellness visits once per year to help you and your care team stay ahead of potential health issues. These visits are designed to focus on prevention, allowing your provider to concentrate on managing your acute and chronic conditions during your regular appointments.  Please note that Annual Wellness Visits do not include a physical exam. Some assessments may be limited, especially if the visit was conducted virtually. If needed, we may recommend a separate in-person follow-up with your provider.  Ongoing Care Seeing your primary care provider every 3 to 6 months helps us  monitor your health and provide consistent, personalized care.   Referrals If a referral was made during today's visit and you haven't received any updates within two weeks, please contact the referred provider directly to check on the status.  Recommended Screenings:  Health Maintenance  Topic Date Due   Flu Shot  06/22/2024   COVID-19 Vaccine (8 - 2025-26 season) 07/23/2024   Medicare Annual Wellness Visit  09/11/2025   DTaP/Tdap/Td vaccine (4 - Td or Tdap) 06/02/2028   Pneumococcal Vaccine for age over 35  Completed   Zoster (Shingles) Vaccine  Completed   Meningitis B Vaccine  Aged Out       09/11/2024   10:12 AM  Advanced Directives  Does Patient Have a Medical Advance Directive? Yes  Type of Estate agent of [place];Living will  Copy of Healthcare Power of Attorney in Chart? Yes - validated most recent copy scanned in chart (See row information)   Advance Care Planning is important because it: Ensures you receive medical care that aligns with your values, goals, and preferences. Provides guidance to your family and loved  ones, reducing the emotional burden of decision-making during critical moments.  Vision: Annual vision screenings are recommended for early detection of glaucoma, cataracts, and diabetic retinopathy. These exams can also reveal signs of chronic conditions such as diabetes and high blood pressure.  Dental: Annual dental screenings help detect early signs of oral cancer, gum disease, and other conditions linked to overall health, including heart disease and diabetes.  Please see the attached documents for additional preventive care recommendations.

## 2024-09-11 NOTE — Progress Notes (Signed)
 Subjective:   Nakai Pollio is a 83 y.o. who presents for a Medicare Wellness preventive visit.  As a reminder, Annual Wellness Visits don't include a physical exam, and some assessments may be limited, especially if this visit is performed virtually. We may recommend an in-person follow-up visit with your provider if needed.  Visit Complete: In person    Persons Participating in Visit: Patient.  AWV Questionnaire: No: Patient Medicare AWV questionnaire was not completed prior to this visit.  Cardiac Risk Factors include: advanced age (>35men, >2 women);dyslipidemia;male gender     Objective:    Today's Vitals   09/11/24 0959  BP: 122/64  Pulse: 90  Temp: 98.3 F (36.8 C)  TempSrc: Oral  SpO2: 94%  Weight: 178 lb 12.8 oz (81.1 kg)  Height: 5' 11 (1.803 m)   Body mass index is 24.94 kg/m.     09/11/2024   10:12 AM 02/28/2024    9:41 AM 08/30/2023    2:05 PM 08/27/2022    2:26 PM 02/17/2022    9:29 AM 02/12/2021    9:45 AM 02/12/2020    2:13 PM  Advanced Directives  Does Patient Have a Medical Advance Directive? Yes Yes Yes Yes Yes Yes Yes  Type of Estate agent of [place];Living will Healthcare Power of [place];Living will Healthcare Power of [place];Living will Healthcare Power of [place];Living will Living will Living will;Healthcare Power of [employer] Power of [place];Living will  Does patient want to make changes to medical advance directive?  Yes (Inpatient - patient defers changing a medical advance directive at this time - Information given)   No - Patient declined No - Patient declined No - Patient declined  Copy of Healthcare Power of Attorney in Chart? Yes - validated most recent copy scanned in chart (See row information) Yes - validated most recent copy scanned in chart (See row information) Yes - validated most recent copy scanned in chart (See row information) Yes - validated most recent copy scanned in chart (See row  information)  Yes - validated most recent copy scanned in chart (See row information) No - copy requested    Current Medications (verified) Outpatient Encounter Medications as of 09/11/2024  Medication Sig   Glucosamine Sulfate 500 MG CAPS Take 2 capsules by mouth daily.   guaiFENesin  (MUCINEX ) 600 MG 12 hr tablet Take 1 tablet (600 mg total) by mouth 2 (two) times daily. (Patient taking differently: Take 600 mg by mouth 2 (two) times daily as needed.)   hydrochlorothiazide  (MICROZIDE ) 12.5 MG capsule TAKE 1 CAPSULE(12.5 MG) BY MOUTH DAILY   Multiple Vitamin (MULTIVITAMIN) capsule Take 1 capsule by mouth daily.   rosuvastatin  (CRESTOR ) 20 MG tablet Take 1 tablet (20 mg total) by mouth daily.   Tiotropium Bromide Monohydrate  (SPIRIVA  RESPIMAT) 2.5 MCG/ACT AERS Inhale 2 puffs into the lungs daily.   benzonatate  (TESSALON ) 100 MG capsule Take 1 capsule (100 mg total) by mouth 2 (two) times daily as needed for cough. (Patient not taking: Reported on 09/11/2024)   bisacodyl 5 MG EC tablet Take 10 mg by mouth daily as needed for moderate constipation. (Patient not taking: Reported on 09/11/2024)   HYDROcodone -acetaminophen  (NORCO/VICODIN) 5-325 MG tablet Take 1 tablet by mouth 2 (two) times daily as needed for moderate pain (pain score 4-6). (Patient not taking: Reported on 09/11/2024)   No facility-administered encounter medications on file as of 09/11/2024.    Allergies (verified) Patient has no known allergies.   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 jakie   Family  History  Problem Relation Age of Onset   Coronary artery disease Mother        died in late 15s   Other Father        died age 26s, unknown cause   Diabetes Sister    Other Brother        unknown cause of death   Alcohol abuse Sister    Heart disease Sister        stents   Heart disease Sister        stents   Colon cancer Neg Hx    Esophageal cancer Neg Hx    Rectal cancer Neg Hx    Stomach cancer Neg Hx    Social History   Socioeconomic History   Marital status: Married    Spouse name: Not on file   Number of children: 3   Years of education: Not on file   Highest education level: Not on file  Occupational History   Occupation: retired Chief Strategy Officer: [employer]  Tobacco Use   Smoking status: Former    Current packs/day: 0.00    Types: Cigarettes    Quit date: 02/21/2023    Years since quitting: 1.5   Smokeless tobacco: Never   Tobacco comments:    Smokes 4-5 cigarettes daily  Vaping Use   Vaping status: Never Used  Substance and Sexual Activity   Alcohol use: No    Alcohol/week: 0.0 standard drinks of alcohol   Drug use: No   Sexual activity: Yes    Partners: Male  Other Topics Concern   Not on file  Social History Narrative   Not on file   Social Drivers of Health   Financial Resource Strain: Low Risk  (09/11/2024)   Overall Financial Resource Strain (CARDIA)    Difficulty of Paying Living Expenses: Not hard at all  Food Insecurity: No Food Insecurity (09/11/2024)   Hunger Vital Sign    Worried About Running Out of Food in the Last Year: Never true    Ran Out of Food in the Last Year: Never true  Transportation Needs: No Transportation Needs (09/11/2024)   PRAPARE - Administrator, Civil Service (Medical): No    Lack of Transportation (Non-Medical): No  Physical Activity: Sufficiently Active (09/11/2024)   Exercise Vital Sign    Days of Exercise per Week: 4 days    Minutes of Exercise per Session: 60 min  Stress: No Stress  Concern Present (09/11/2024)   [employer] of Occupational Health - Occupational Stress Questionnaire    Feeling of Stress: Not at all  Social Connections: Moderately Integrated (09/11/2024)   Social Connection and Isolation Panel    Frequency of Communication with Friends and Family: More than three times a week    Frequency of Social Gatherings with Friends and Family: Once a week    Attends Religious Services: More than 4 times per year    Active Member  of Clubs or Organizations: No    Attends Banker Meetings: Never    Marital Status: Married    Tobacco Counseling Counseling given: Not Answered Tobacco comments: Smokes 4-5 cigarettes daily    Clinical Intake:  Pre-visit preparation completed: Yes  Pain : No/denies pain     Nutritional Status: BMI of 19-24  Normal Nutritional Risks: Nausea/ vomitting/ diarrhea (vomiting since "Sunday) Diabetes: No  Lab Results  Component Value Date   HGBA1C 6.4 (A) 02/22/2023   HGBA1C 6.0 (H) 02/17/2022     How often do you need to have someone help you when you read instructions, pamphlets, or other written materials from your doctor or pharmacy?: 1 - Never  Interpreter Needed?: No  Information entered by :: NAllen LPN   Activities of Daily Living     09/11/2024   10:02 AM 02/28/2024    9:38 AM  In your present state of health, do you have any difficulty performing the following activities:  Hearing? 0 0  Vision? 0 1  Comment  has glasses getting fixed  Difficulty concentrating or making decisions? 1 0  Walking or climbing stairs? 0 0  Dressing or bathing? 0 0  Doing errands, shopping? 0 0  Preparing Food and eating ? N N  Using the Toilet? N N  In the past six months, have you accidently leaked urine? N N  Do you have problems with loss of bowel control? N N  Managing your Medications? N N  Managing your Finances? N N  Housekeeping or managing your Housekeeping? N N    Patient Care Team: Lalonde,  John C, MD as PCP - General (Family Medicine)  I have updated your Care Teams any recent Medical Services you may have received from other providers in the past year.     Assessment:   This is a routine wellness examination for Nev[place].  Hearing/Vision screen Hearing Screening - Comments:: Denies hearing issues Vision Screening - Comments:: No regular eye exams   Goals Addressed             This Visit's Progress    Patient Stated       09/11/2024, stay healthy       Depression Screen     09/11/2024   10:16 AM 02/28/2024    9:40 AM 08/30/2023    2:07 PM 08/27/2022    2:27 PM 02/17/2022    9:31 AM 02/12/2021    9:46 AM 02/12/2020    1:43 PM  PHQ 2/9 Scores  PHQ - 2 Score 0 0 0 0 0 0 0  PHQ- 9 Score 0  0 0       Fall Risk     10" /21/2025   10:14 AM 02/28/2024    9:41 AM 08/30/2023    2:06 PM 02/22/2023    9:26 AM 02/10/2023    9:57 AM  Fall Risk   Falls in the past year? 0  0 0 0  Number falls in past yr: 0 0 0 0 0  Injury with Fall? 0 0 0 0 0  Risk for fall due to : Medication side effect No Fall Risks Medication side effect No Fall Risks No Fall Risks  Follow up Falls evaluation completed;Falls prevention discussed Falls evaluation completed Falls prevention discussed;Falls evaluation completed Falls evaluation completed Falls evaluation completed    MEDICARE RISK AT HOME:  Medicare Risk at Home Any stairs in or around the home?: No If so, are there any without handrails?: No  Home free of loose throw rugs in walkways, pet beds, electrical cords, etc?: Yes Adequate lighting in your home to reduce risk of falls?: Yes Life alert?: No Use of a cane, walker or w/c?: No Grab bars in the bathroom?: Yes Shower chair or bench in shower?: No Elevated toilet seat or a handicapped toilet?: No  TIMED UP AND GO:  Was the test performed?  Yes  Length of time to ambulate 10 feet: 5 sec Gait steady and fast without use of assistive device  Cognitive Function: 6CIT  completed    02/17/2022   12:55 PM  MMSE - Mini Mental State Exam  Orientation to time 4  Orientation to Place 5  Registration 3  Attention/ Calculation 0  Recall 3  Language- name 2 objects 2  Language- repeat 1  Language- follow 3 step command 3  Language- read & follow direction 1  Write a sentence 1  Copy design 1  Total score 24        09/11/2024   10:17 AM 08/30/2023    2:08 PM 08/27/2022    2:30 PM 02/17/2022    9:33 AM  6CIT Screen  What Year? 0 points 0 points 0 points 0 points  What month? 0 points 0 points 0 points 0 points  What time? 0 points 0 points 0 points 0 points  Count back from 20 2 points 0 points 0 points 0 points  Months in reverse 4 points 4 points 4 points 4 points  Repeat phrase 0 points 0 points 0 points 10 points  Total Score 6 points 4 points 4 points 14 points    Immunizations Immunization History  Administered Date(s) Administered   Fluad Quad(high Dose 65+) 09/04/2019, 10/06/2020, 08/25/2021, 08/27/2022, 07/20/2023   INFLUENZA, HIGH DOSE SEASONAL PF 09/04/2013, 08/28/2015, 10/08/2016, 09/06/2017, 10/05/2017   Influenza-Unspecified 08/08/2018, 10/09/2019, 06/22/2021   Moderna Covid-19 Vaccine Bivalent Booster 29yrs & up 08/25/2021   Moderna Sars-Covid-2 Vaccination 12/14/2019, 01/18/2020, 09/18/2020   PNEUMOCOCCAL CONJUGATE-20 02/28/2024   Pfizer(Comirnaty)Fall Seasonal Vaccine 12 years and older 09/28/2022   Pneumococcal Conjugate-13 08/28/2015, 09/07/2017   Pneumococcal Polysaccharide-23 03/25/2006   Td 11/22/2000   Tdap 01/30/2013, 06/02/2018   Unspecified SARS-COV-2 Vaccination 07/20/2023   Zoster Recombinant(Shingrix) 06/02/2018, 08/08/2018   Zoster, Live 05/10/2006    Screening Tests Health Maintenance  Topic Date Due   Influenza Vaccine  06/22/2024   COVID-19 Vaccine (8 - 2025-26 season) 07/23/2024   Medicare Annual Wellness (AWV)  09/11/2025   DTaP/Tdap/Td (4 - Td or Tdap) 06/02/2028   Pneumococcal Vaccine: 50+ Years   Completed   Zoster Vaccines- Shingrix  Completed   Meningococcal B Vaccine  Aged Out    Health Maintenance Items Addressed: Will get flu and covid at a later date.  Additional Screening:  Vision Screening: Recommended annual ophthalmology exams for early detection of glaucoma and other disorders of the eye. Is the patient up to date with their annual eye exam?  No  Who is the provider or what is the name of the office in which the patient attends annual eye exams? none  Dental Screening: Recommended annual dental exams for proper oral hygiene  Community Resource Referral / Chronic Care Management: CRR required this visit?  No   CCM required this visit?  No   Plan:    I have personally reviewed and noted the following in the patient's chart:   Medical and social history Use of alcohol, tobacco or illicit drugs  Current medications and supplements including opioid prescriptions.  Patient is not currently taking opioid prescriptions. Functional ability and status Nutritional status Physical activity Advanced directives List of other physicians Hospitalizations, surgeries, and ER visits in previous 12 months Vitals Screenings to include cognitive, depression, and falls Referrals and appointments  In addition, I have reviewed and discussed with patient certain preventive protocols, quality metrics, and best practice recommendations. A written personalized care plan for preventive services as well as general preventive health recommendations were provided to patient.   Ardella FORBES Dawn, LPN   [id number]   After Visit Summary: (In Person-Printed) AVS printed and given to the patient  Notes: trouble with digestion and constipation. Has appointment with Dr. Joyce.

## 2024-09-12 ENCOUNTER — Ambulatory Visit: Admitting: Family Medicine

## 2024-10-08 ENCOUNTER — Encounter: Payer: Self-pay | Admitting: Internal Medicine

## 2024-11-05 ENCOUNTER — Encounter (HOSPITAL_BASED_OUTPATIENT_CLINIC_OR_DEPARTMENT_OTHER): Payer: Self-pay

## 2024-11-05 ENCOUNTER — Encounter: Payer: Self-pay | Admitting: Family Medicine

## 2024-11-05 ENCOUNTER — Ambulatory Visit: Payer: Self-pay | Admitting: Family Medicine

## 2024-11-05 VITALS — BP 118/72 | HR 71 | Ht 71.0 in | Wt 185.4 lb

## 2024-11-05 DIAGNOSIS — K219 Gastro-esophageal reflux disease without esophagitis: Secondary | ICD-10-CM

## 2024-11-05 DIAGNOSIS — J42 Unspecified chronic bronchitis: Secondary | ICD-10-CM

## 2024-11-05 DIAGNOSIS — R059 Cough, unspecified: Secondary | ICD-10-CM

## 2024-11-05 DIAGNOSIS — R0982 Postnasal drip: Secondary | ICD-10-CM

## 2024-11-05 MED ORDER — FLUTICASONE PROPIONATE 50 MCG/ACT NA SUSP: 2.0000 | Freq: Every day | NASAL | 6 refills | Status: AC

## 2024-11-05 MED ORDER — FAMOTIDINE 40 MG PO TABS: 40.0000 mg | ORAL_TABLET | Freq: Every day | ORAL | 1 refills | Status: AC

## 2024-11-05 MED ORDER — BREZTRI AEROSPHERE 160-9-4.8 MCG/ACT IN AERO: 2.0000 | INHALATION_SPRAY | Freq: Two times a day (BID) | RESPIRATORY_TRACT | 11 refills | Status: AC

## 2024-11-05 NOTE — Progress Notes (Signed)
 Name: Joseph West   Date of Visit: 11/05/2024   Date of last visit with me: 08/06/2024   CHIEF COMPLAINT:  Chief Complaint  Patient presents with   other    3 month F/U- He states he is feeling better, He does have some chest congestion       HPI:  Discussed the use of AI scribe software for clinical note transcription with the patient, who gave verbal consent to proceed.  History of Present Illness   Joseph West is an 83 year old male who presents with persistent cough and joint pain.  He has a persistent cough. He uses an inhaler, Breztri , which he ran out of. He also experiences congestion, particularly after swimming.  He experiences joint pain, particularly in his knees, affecting his mobility. He uses a knee brace that provides warmth and support. The pain sometimes radiates down his leg. Despite this, he continues to engage in physical activities, including swimming and gym workouts, which he believes help manage his symptoms.  He has a history of smoking, having smoked a pipe in the past, but has since cut down. He also experiences symptoms of acid reflux and is currently not on any medication for it, as he was taken off his previous medication after long-term use. He maintains an active lifestyle, regularly visiting the gym and swimming.         OBJECTIVE:       09/11/2024   10:16 AM  Depression screen PHQ 2/9  Decreased Interest 0  Down, Depressed, Hopeless 0  PHQ - 2 Score 0  Altered sleeping 0  Tired, decreased energy 0  Change in appetite 0  Feeling bad or failure about yourself  0  Trouble concentrating 0  Moving slowly or fidgety/restless 0  Suicidal thoughts 0  PHQ-9 Score 0   Difficult doing work/chores Not difficult at all     Data saved with a previous flowsheet row definition     BP Readings from Last 3 Encounters:  11/05/24 118/72  09/11/24 130/76  09/11/24 122/64    BP 118/72   Pulse 71   Ht 5' 11 (1.803 m)   Wt 185 lb 6.4 oz  (84.1 kg)   SpO2 97%   BMI 25.86 kg/m    Physical Exam          Physical Exam Constitutional:      Appearance: Normal appearance.  Neurological:     General: No focal deficit present.     Mental Status: He is alert and oriented to person, place, and time. Mental status is at baseline.     ASSESSMENT/PLAN:   Assessment & Plan Cough, unspecified type  Chronic bronchitis, unspecified chronic bronchitis type (HCC)  Post-nasal drip  Gastroesophageal reflux disease without esophagitis    Assessment and Plan    Chronic bronchitis Persistent cough likely due to sinus drainage and lung issues. Symptoms exacerbated by environmental factors. Smoking history with pipe use. - Prescribed Breztri  inhaler. - Prescribed Flonase  nasal spray for nightly use. - Referred to pulmonologist for further evaluation and testing.  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 Symptoms suggest possible bursitis. - consider steroid injection for greater trochanteric bursa next week.  Gastroesophageal reflux disease GERD symptoms previously managed with medication, currently not taking any. Plan to trial a new medication before considering previous medication. - Prescribed new GERD medication, one pill daily. - Will reassess effectiveness and consider previous medication if needed.  Joseph West A. Vita MD [facility]

## 2024-11-07 ENCOUNTER — Other Ambulatory Visit (INDEPENDENT_AMBULATORY_CARE_PROVIDER_SITE_OTHER): Payer: Self-pay

## 2024-11-07 ENCOUNTER — Encounter: Payer: Self-pay | Admitting: Family Medicine

## 2024-11-07 ENCOUNTER — Ambulatory Visit: Admitting: Family Medicine

## 2024-11-07 VITALS — BP 132/76 | HR 78 | Wt 183.2 lb

## 2024-11-07 DIAGNOSIS — M25559 Pain in unspecified hip: Secondary | ICD-10-CM

## 2024-11-07 DIAGNOSIS — M25551 Pain in right hip: Secondary | ICD-10-CM | POA: Diagnosis not present

## 2024-11-07 NOTE — Patient Instructions (Signed)
 Please pick up fluticasone  propionate and pepcid  from the over the counter section of the pharmacy.

## 2024-11-07 NOTE — Progress Notes (Signed)
 Name: Jary Louvier   Date of Visit: 11/07/2024   Date of last visit with me: 11/05/2024   CHIEF COMPLAINT:  Chief Complaint  Patient presents with   other    Hip injection, rt. Hip but both are bad, may need shot in both sides,        HPI:  Discussed the use of AI scribe software for clinical note transcription with the patient, who gave verbal consent to proceed.  History of Present Illness   Joseph West is an 83 year old male who presents with bilateral hip pain.  He experiences pain on both sides of his hips, specifically at the anterior aspect, with the right side being more painful than the left. The pain is localized and does not radiate. It affects his ability to walk, particularly when going uphill, as evidenced by difficulty walking with his granddaughter the previous night. The pain is present in the morning and persists throughout the day with movement.  He is on a fixed income and finds some medications expensive, mentioning costs of $43 and $23 for certain medications. He recalls that one of the medications might be Breztri  or Flonase , which he does not need.  He is active and goes to the gym, although he did not go on the day of the visit. He uses the hot tub and sauna at the gym.         OBJECTIVE:       09/11/2024   10:16 AM  Depression screen PHQ 2/9  Decreased Interest 0  Down, Depressed, Hopeless 0  PHQ - 2 Score 0  Altered sleeping 0  Tired, decreased energy 0  Change in appetite 0  Feeling bad or failure about yourself  0  Trouble concentrating 0  Moving slowly or fidgety/restless 0  Suicidal thoughts 0  PHQ-9 Score 0   Difficult doing work/chores Not difficult at all     Data saved with a previous flowsheet row definition     BP Readings from Last 3 Encounters:  11/07/24 132/76  11/05/24 118/72  09/11/24 130/76    BP 132/76   Pulse 78   Wt 183 lb 3.2 oz (83.1 kg)   BMI 25.55 kg/m    Physical Exam   MUSCULOSKELETAL:  Tenderness in the right anterior hip region. TTP over      Physical Exam Constitutional:      Appearance: Normal appearance.  Neurological:     General: No focal deficit present.     Mental Status: He is alert and oriented to person, place, and time. Mental status is at baseline.     ASSESSMENT/PLAN:   Assessment & Plan Greater trochanteric pain syndrome    Assessment and Plan    Greater trochanteric pain syndrome Chronic bilateral pain, right side more severe, exacerbated by walking and climbing. - Administered injection to right side. - Provided two sets of home exercises. - Advised against gym activities today and tomorrow; resume on Friday. - Reassess in two weeks to evaluate injection effectiveness and consider left side treatment.    US -Guided Greater Trochanteric Bursa Injection, right After discussion on risks/benefits/indications and informed verbal consent was obtained, a timeout was performed. The patient was lying in lateral recumbent position on exam table. Using ultrasound guidance, the greater trochanter was identified. The area overlying the trochanteric bursa was then prepped with Betadine and alcohol swabs. Following sterile precautions, ultrasound was reapplied to visualize needle guidance with a 22-gauge 3.5 needle utilizing an in-plane approach to inject  the bursa with 2:2:1 lidocaine :bupivicaine:betamethasone. Delivery of the injectate was visualized into the region of hypoechoic fluid of the greater trochanteric bursa. Patient tolerated procedure well without immediate complications.           Alizandra Loh A. Vita MD [facility]

## 2024-11-27 ENCOUNTER — Ambulatory Visit (INDEPENDENT_AMBULATORY_CARE_PROVIDER_SITE_OTHER): Admitting: Family Medicine

## 2024-11-27 ENCOUNTER — Other Ambulatory Visit: Payer: Self-pay

## 2024-11-27 VITALS — BP 120/64 | HR 73 | Wt 181.0 lb

## 2024-11-27 DIAGNOSIS — M25511 Pain in right shoulder: Secondary | ICD-10-CM | POA: Diagnosis not present

## 2024-11-27 DIAGNOSIS — G8929 Other chronic pain: Secondary | ICD-10-CM | POA: Diagnosis not present

## 2024-11-27 DIAGNOSIS — M25559 Pain in unspecified hip: Secondary | ICD-10-CM

## 2024-11-27 DIAGNOSIS — M25551 Pain in right hip: Secondary | ICD-10-CM

## 2024-11-27 MED ORDER — BUPIVACAINE HCL 0.25 % IJ SOLN
2.0000 mL | Freq: Once | INTRAMUSCULAR | Status: AC
  Administered 2024-11-27: 2 mL

## 2024-11-27 MED ORDER — LIDOCAINE HCL 1 % IJ SOLN
5.0000 mL | Freq: Once | INTRAMUSCULAR | Status: AC
  Administered 2024-11-27: 5 mL via INTRADERMAL

## 2024-11-27 MED ORDER — METHYLPREDNISOLONE ACETATE 40 MG/ML IJ SUSP
40.0000 mg | Freq: Once | INTRAMUSCULAR | Status: AC
  Administered 2024-11-27: 40 mg via INTRAMUSCULAR

## 2024-11-27 NOTE — Progress Notes (Signed)
 "  Name: Joseph West   Date of Visit: 11/27/2024   Date of last visit with me: 11/07/2024   CHIEF COMPLAINT:  Chief Complaint  Patient presents with   Follow-up    3 week follow up. Shoulders are about the same, hip bothering him the most, especially when doing a lot of walking.        HPI:  Discussed the use of AI scribe software for clinical note transcription with the patient, who gave verbal consent to proceed.  History of Present Illness   Joseph West is an 84 year old male who presents with persistent hip and shoulder discomfort.  He experiences persistent soreness in his hip, which has not improved despite previous interventions. The pain is exacerbated by walking, especially over longer distances, and is located in the same area where a previous injection was administered. He engages in exercises and swimming, but the pain persists. Heat exposure, such as being in a hot room, seems to worsen the symptoms.  He also has soreness in his shoulder, which is more pronounced in the morning. He reports that his shoulder remains sore throughout the day. He has not had an injection in the shoulder before but is open to trying it as previous injections have provided relief for other areas.  He experiences dizziness upon standing, particularly in the morning, which resolves after a short period. He attributes this to age-related changes and manages it by taking his time when transitioning from sitting to standing.  He rates his current pain level as a five out of ten, indicating moderate discomfort. The right side is more bothersome than the left. No shortness of breath.         OBJECTIVE:       09/11/2024   10:16 AM  Depression screen PHQ 2/9  Decreased Interest 0  Down, Depressed, Hopeless 0  PHQ - 2 Score 0  Altered sleeping 0  Tired, decreased energy 0  Change in appetite 0  Feeling bad or failure about yourself  0  Trouble concentrating 0  Moving slowly or  fidgety/restless 0  Suicidal thoughts 0  PHQ-9 Score 0   Difficult doing work/chores Not difficult at all     Data saved with a previous flowsheet row definition     BP Readings from Last 3 Encounters:  11/27/24 120/64  11/07/24 132/76  11/05/24 118/72    BP 120/64   Pulse 73   Wt 181 lb (82.1 kg)   SpO2 97%   BMI 25.24 kg/m    Physical Exam          Physical Exam Constitutional:      Appearance: Normal appearance.  Neurological:     General: No focal deficit present.     Mental Status: He is alert and oriented to person, place, and time. Mental status is at baseline.    TTP over Glenohumeral head, mild pain with flexion and extension. No pain with abduction between 0-40, pain with abudction between 40-90 degrees. Negatve empty cans and neers.  ASSESSMENT/PLAN:   Assessment & Plan Greater trochanteric pain syndrome    Assessment and Plan    Greater trochanteric pain syndrome Chronic right hip pain exacerbated by activity and heat. Previous injection indicated correct anatomical location. Muscle weakness contributes to pain. - Advised daily hip muscle strengthening exercises. - Instructed to avoid heat exposure, use ice. - Recommended gradual increase in exercise intensity as pain improves.  Shoulder osteoarthritis Chronic shoulder soreness, likely osteoarthritis. Previous injections effective  for symptom management. - Administered shoulder injection for pain relief. - Monitor response to injection for diagnosis confirmation and effectiveness.  Orthostatic dizziness Dizziness upon standing likely due to age-related blood flow changes. Symptoms resolve with movement. - Advised to rise slowly from sitting or lying positions.      US -guided glenohumeral joint injection, right shoulder After discussion on risks/benefits/indications, informed verbal consent was obtained. A timeout was then performed. The patient was positioned lying lateral recumbent on  examination table. The patient's shoulder was prepped with betadine and multiple alcohol swabs and utilizing ultrasound guidance, the patient's glenohumeral joint was identified on ultrasound. Using ultrasound guidance a 22-gauge, 3.5 inch needle with a mixture of 2:2:2 cc's lidocaine :bupivicaine:depomedrol was directed from a lateral to medial direction via in-plane technique into the glenohumeral joint with visualization of appropriate spread of injectate into the joint. Patient tolerated the procedure well without immediate complications.      I personally spent a total of 38 minutes in the care of the patient today including preparing to see the patient, getting/reviewing separately obtained history, performing a medically appropriate exam/evaluation, counseling and educating, placing orders, referring and communicating with other health care professionals, documenting clinical information in the EHR, independently interpreting results, communicating results, and coordinating care.  Olvin Rohr A. Vita MD [facility] "

## 2024-12-06 ENCOUNTER — Ambulatory Visit: Admitting: Family Medicine

## 2024-12-06 ENCOUNTER — Ambulatory Visit: Payer: Self-pay

## 2024-12-06 ENCOUNTER — Encounter: Payer: Self-pay | Admitting: Family Medicine

## 2024-12-06 VITALS — BP 120/78 | HR 105 | Wt 187.2 lb

## 2024-12-06 DIAGNOSIS — I739 Peripheral vascular disease, unspecified: Secondary | ICD-10-CM | POA: Diagnosis not present

## 2024-12-06 DIAGNOSIS — M79661 Pain in right lower leg: Secondary | ICD-10-CM | POA: Diagnosis not present

## 2024-12-06 DIAGNOSIS — N1831 Chronic kidney disease, stage 3a: Secondary | ICD-10-CM

## 2024-12-06 LAB — CREATININE, SERUM
Creatinine, Ser: 1.39 mg/dL — ABNORMAL HIGH (ref 0.76–1.27)
eGFR: 50 mL/min/1.73 — ABNORMAL LOW

## 2024-12-06 MED ORDER — CLOPIDOGREL BISULFATE 75 MG PO TABS: 75.0000 mg | ORAL_TABLET | Freq: Every day | ORAL | 1 refills | Status: DC

## 2024-12-06 MED ORDER — TRAMADOL HCL 50 MG PO TABS: 50.0000 mg | ORAL_TABLET | Freq: Three times a day (TID) | ORAL | 0 refills | Status: DC | PRN

## 2024-12-06 NOTE — Addendum Note (Signed)
 Addended by: Monic Engelmann on: 12/06/2024 02:46 PM   Modules accepted: Orders

## 2024-12-06 NOTE — Telephone Encounter (Signed)
 FYI Only or Action Required?: FYI only for provider: appointment scheduled on today.  Patient was last seen in primary care on 11/27/2024 by Vita Morrow, MD.  Called Nurse Triage reporting Leg Pain.  Symptoms began chronic but worse starting yesterday-cramping.  Interventions attempted: Rest, massage, Ice and heat application.  Symptoms are: gradually improving.  Triage Disposition: See PCP Within 2 Weeks  Patient/caregiver understands and will follow disposition?: Yes   Reason for Disposition  Leg pain or muscle cramp is a chronic symptom (recurrent or ongoing AND present > 4 weeks)  Answer Assessment - Initial Assessment Questions 1. ONSET: When did the pain start?      Chronic but worse starting yesterday  2. LOCATION: Where is the pain located?      Back of knee to ankle  3. PAIN: How bad is the pain?    (Scale 1-10; or mild, moderate, severe)     Severe  4. WORK OR EXERCISE: Has there been any recent work or exercise that involved this part of the body?      no 5. CAUSE: What do you think is causing the leg pain?     Arthritis  6. OTHER SYMPTOMS: Do you have any other symptoms? (e.g., chest pain, back pain, breathing difficulty, swelling, rash, fever, numbness, weakness)     No difficulty ambulating. Leg cramping throughout the night, treated with ice, heat, and massage  Protocols used: Leg Pain-A-AH

## 2024-12-06 NOTE — Addendum Note (Signed)
 Addended by: VICCI HUSBAND A on: 12/06/2024 02:57 PM   Modules accepted: Orders

## 2024-12-06 NOTE — Addendum Note (Signed)
 Addended by: VICCI HUSBAND A on: 12/06/2024 03:27 PM   Modules accepted: Orders

## 2024-12-06 NOTE — Addendum Note (Signed)
 Addended by: Shimshon Narula on: 12/06/2024 02:32 PM   Modules accepted: Orders

## 2024-12-06 NOTE — Progress Notes (Addendum)
 "  Name: Joseph West   Date of Visit: 12/06/24   Date of last visit with me: 11/27/2024   CHIEF COMPLAINT:  Chief Complaint  Patient presents with   Acute Visit    Leg pain. Started a few days ago, calf pain.        HPI:  Discussed the use of AI scribe software for clinical note transcription with the patient, who gave verbal consent to proceed.  History of Present Illness   Joseph West is an 84 year old male who presents with sudden onset calf pain.  He has experienced sudden onset pain in his right calf for the past two days. The pain occurs when walking and is described as non-tender to touch, without radiation down the leg. It also occurs at night when lying down, requiring him to sit up with his legs on the floor for relief. He initially thought it was a cramp. Icing and applying heat have been attempted, but their effectiveness is uncertain. No recent changes in activity or long walks that could have triggered the pain. Daily exercises do not typically cause pain, and he has not been swimming recently. Pressing on the calf does not cause pain, but stepping on it does, and the pain does not improve with continued walking.  He mentions soreness in his hip and shoulder, which has improved since receiving a shot in his shoulder about two weeks ago. The shoulder and hip pain are less severe, and the soreness improves as the day progresses.  He has a history of smoking, having smoked a pipe in the past and recently resumed smoking due to stress. He does not take aspirin but took Advil last night, which did not provide significant relief. He also drank Gatorade and water, which did not help with the pain.         OBJECTIVE:       09/11/2024   10:16 AM  Depression screen PHQ 2/9  Decreased Interest 0  Down, Depressed, Hopeless 0  PHQ - 2 Score 0  Altered sleeping 0  Tired, decreased energy 0  Change in appetite 0  Feeling bad or failure about yourself  0  Trouble  concentrating 0  Moving slowly or fidgety/restless 0  Suicidal thoughts 0  PHQ-9 Score 0   Difficult doing work/chores Not difficult at all     Data saved with a previous flowsheet row definition     BP Readings from Last 3 Encounters:  12/06/24 120/78  11/27/24 120/64  11/07/24 132/76    BP 120/78   Pulse (!) 105   Wt 187 lb 3.2 oz (84.9 kg)   SpO2 97%   BMI 26.11 kg/m    Physical Exam   EXTREMITIES: No tenderness on palpation of the right calf. No signs of swelling or erythmea. Homan sign negaitve.      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 Smoking cessation advised. Low suspicion for DVT, but higher concern for intermittent claudication. Will recommend stopping smoking at this time. - Prescribed Plavix  for one month. - Ordered stat CT angiography of the lower extremity with contrast. - Prescribed tramadol  for pain management as needed. - Advised icing the affected area.  CKD stage III - Given stat CT with contrast,  will go ahead and do stat creatinine today  Osteoarthritis Chronic osteoarthritis with persistent hip and shoulder pain, improved post shoulder injection.  Smoking/Tobacco Cessation Counseling Joseph West is a current user of tobacco or nicotine products. He is ready to quit at this time. Counseling provided today addressed the risks of continued use and the benefits of cessation. Discussed tobacco/nicotine use history, readiness to quit, and evidence-based treatment options including behavioral strategies, support resources, and pharmacologic therapies. Provided encouragement and educational materials on steps and resources to quit smoking. Patient questions were addressed, and  follow-up recommended for continued support. Total time spent on counseling: 4 minutes.     Joseph West A. Vita MD [facility] "

## 2024-12-07 ENCOUNTER — Ambulatory Visit: Payer: Self-pay

## 2024-12-07 ENCOUNTER — Emergency Department (HOSPITAL_COMMUNITY)
Admission: EM | Admit: 2024-12-07 | Discharge: 2024-12-08 | Disposition: A | Discharge status: Home / Self Care | Attending: Emergency Medicine | Admitting: Emergency Medicine

## 2024-12-07 ENCOUNTER — Encounter (HOSPITAL_COMMUNITY): Payer: Self-pay

## 2024-12-07 ENCOUNTER — Encounter: Payer: Self-pay | Admitting: Family Medicine

## 2024-12-07 ENCOUNTER — Ambulatory Visit (HOSPITAL_COMMUNITY)
Admission: RE | Admit: 2024-12-07 | Discharge: 2024-12-07 | Disposition: A | Discharge status: Home / Self Care | Source: Ambulatory Visit | Attending: Family Medicine | Admitting: Family Medicine

## 2024-12-07 DIAGNOSIS — Z87891 Personal history of nicotine dependence: Secondary | ICD-10-CM | POA: Diagnosis not present

## 2024-12-07 DIAGNOSIS — M79669 Pain in unspecified lower leg: Secondary | ICD-10-CM | POA: Diagnosis not present

## 2024-12-07 DIAGNOSIS — M79661 Pain in right lower leg: Secondary | ICD-10-CM

## 2024-12-07 DIAGNOSIS — I739 Peripheral vascular disease, unspecified: Secondary | ICD-10-CM | POA: Insufficient documentation

## 2024-12-07 DIAGNOSIS — M25551 Pain in right hip: Secondary | ICD-10-CM | POA: Diagnosis present

## 2024-12-07 DIAGNOSIS — G8929 Other chronic pain: Secondary | ICD-10-CM | POA: Diagnosis present

## 2024-12-07 MED ORDER — OXYCODONE HCL 5 MG PO TABS
5.0000 mg | ORAL_TABLET | Freq: Once | ORAL | Status: AC
  Administered 2024-12-08: 5 mg via ORAL
  Filled 2024-12-07: qty 1

## 2024-12-07 MED ORDER — IOHEXOL 350 MG/ML SOLN
100.0000 mL | Freq: Once | INTRAVENOUS | Status: AC | PRN
  Administered 2024-12-07: 100 mL via INTRAVENOUS

## 2024-12-07 NOTE — Telephone Encounter (Signed)
 On call called back for Imaging critical results. Gave results to on-call, which is pt's pcp.

## 2024-12-07 NOTE — ED Triage Notes (Signed)
 Pt states that he has been having pain in his R calf on and off for the past week that is getting worse. Went and saw his doctor and they told him to come here. No redness or swelling noted

## 2024-12-07 NOTE — Telephone Encounter (Signed)
 FYI Only or Action Required?: FYI only for provider: Critical results.  Patient was last seen in primary care on 12/06/2024 by Vita Morrow, MD.  Called Nurse Triage reporting Results.  Triage Disposition: Call PCP Now  Patient/caregiver understands and will follow disposition?: Yes    Spoke with Shona with [facility] reports critical results for CTA ABDOMEN AND PELVIS. Results below and in chart. Called on call answering service for [facility]. Waiting for call back from provider to relay results.    IMPRESSION: 1. 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 Recommend CT or MRI follow-up in 12 months and establish or continue care with a vascular specialist. 2. 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 3. 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 4. 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 5. Occlusion of left superficial femoral artery . 6. Indeterminate 1.9 cm heterogeneous mildly hyperdense lesion in the posterior left kidney, slightly increased in size. Recommend renal protocol MRI (preferred) or CT without and with contrast for further characterization. 7. Additional bilateral renal cysts.   Copied from CRM #[id number]. Topic: Clinical - Lab/Test Results >> Dec 07, 2024  5:42 PM Delon HERO wrote: Reason for CRM: Shona calling [facility] is calling for STAT- CT ANGIO AO+BIFEM W & OR WO CONTRAST (Accession [id number]) Reason for Disposition  Lab or radiology calling with CRITICAL test results  Answer Assessment - Initial Assessment Questions 1. REASON FOR CALL or QUESTION: What is your reason for calling today?  or How can I best     Critical CTA Abdomen Pelvis  2. CALLER: Document the source of call. (e.g., laboratory staff, caregiver or patient).      Shona with [facility]  Protocols used: PCP Call - No Triage-A-AH

## 2024-12-08 ENCOUNTER — Ambulatory Visit (HOSPITAL_COMMUNITY)
Admission: RE | Admit: 2024-12-08 | Discharge: 2024-12-08 | Disposition: A | Source: Ambulatory Visit | Attending: Emergency Medicine

## 2024-12-08 DIAGNOSIS — I70213 Atherosclerosis of native arteries of extremities with intermittent claudication, bilateral legs: Secondary | ICD-10-CM

## 2024-12-08 DIAGNOSIS — G8929 Other chronic pain: Secondary | ICD-10-CM | POA: Insufficient documentation

## 2024-12-08 DIAGNOSIS — M79669 Pain in unspecified lower leg: Secondary | ICD-10-CM | POA: Insufficient documentation

## 2024-12-08 DIAGNOSIS — M25551 Pain in right hip: Secondary | ICD-10-CM | POA: Insufficient documentation

## 2024-12-08 NOTE — ED Provider Notes (Signed)
 " [facility] DEPT [facility] Emergency Department Provider Note MRN:  [id number]  Arrival date & time: 12/08/24     Chief Complaint   Leg Pain   History of Present Illness   Joseph West is a 84 y.o. year-old male with a history of peripheral artery disease presenting to the ED with chief complaint of leg pain.  Worsening right calf pain over the past week, seems to be getting worse.  Sent here from primary care doctor's office.  Explains that he had injections in his shoulder and his hip for arthritis and has been doing some increased exercises or physical therapy recently.  Also explains that the pain seems to happen during exertion and goes away with rest but more recently is becoming more of a constant ache.  No chest pain or shortness of breath.  No numbness or weakness.  Review of Systems  A thorough review of systems was obtained and all systems are negative except as noted in the HPI and PMH.   Patient's Health History    Past Medical History:  Diagnosis Date   Allergic rhinitis    Allergy    Arthritis    Cataract    GERD (gastroesophageal reflux disease) 01-10-2007   EGD   Hiatal hernia 01-10-2007   EGD   Internal hemorrhoids without mention of complication 01-10-2007   Colonoscopy   Schatzki's ring    Sickle cell trait    Stricture and stenosis of esophagus 01-10-2007   EGD     Past Surgical History:  Procedure Laterality Date   COLONOSCOPY  2008   Dr. jakie    Family History  Problem Relation Age of Onset   Coronary artery disease Mother        died in late 44s   Other Father        died age 76s, unknown cause   Diabetes Sister    Other Brother        unknown cause of death   Alcohol abuse Sister    Heart disease Sister        stents   Heart disease Sister        stents   Colon cancer Neg Hx    Esophageal cancer Neg Hx    Rectal cancer Neg Hx    Stomach cancer Neg Hx     Social History   Socioeconomic History   Marital status:  Married    Spouse name: Not on file   Number of children: 3   Years of education: Not on file   Highest education level: Not on file  Occupational History   Occupation: retired Chief Strategy Officer: [employer]  Tobacco Use   Smoking status: Former    Current packs/day: 0.00    Types: Cigarettes    Quit date: 02/21/2023    Years since quitting: 1.7   Smokeless tobacco: Never   Tobacco comments:    Smokes 4-5 cigarettes daily  Vaping Use   Vaping status: Never Used  Substance and Sexual Activity   Alcohol use: No    Alcohol/week: 0.0 standard drinks of alcohol   Drug use: No   Sexual activity: Yes    Partners: Male  Other Topics Concern   Not on file  Social History Narrative   Not on file   Social Drivers of Health   Tobacco Use: Medium Risk (12/07/2024)   Patient History    Smoking Tobacco Use: Former    Smokeless Tobacco Use:  Never    Passive Exposure: Not on file  Financial Resource Strain: Low Risk (09/11/2024)   Overall Financial Resource Strain (CARDIA)    Difficulty of Paying Living Expenses: Not hard at all  Food Insecurity: No Food Insecurity (09/11/2024)   Epic    Worried About Programme Researcher, Broadcasting/film/video in the Last Year: Never true    Ran Out of Food in the Last Year: Never true  Transportation Needs: No Transportation Needs (09/11/2024)   Epic    Lack of Transportation (Medical): No    Lack of Transportation (Non-Medical): No  Physical Activity: Sufficiently Active (09/11/2024)   Exercise Vital Sign    Days of Exercise per Week: 4 days    Minutes of Exercise per Session: 60 min  Stress: No Stress Concern Present (09/11/2024)   [employer] of Occupational Health - Occupational Stress Questionnaire    Feeling of Stress: Not at all  Social Connections: Moderately Integrated (09/11/2024)   Social Connection and Isolation Panel    Frequency of Communication with Friends and Family: More than three times a week    Frequency of Social Gatherings with  Friends and Family: Once a week    Attends Religious Services: More than 4 times per year    Active Member of Clubs or Organizations: No    Attends Banker Meetings: Never    Marital Status: Married  Catering Manager Violence: Not At Risk (09/11/2024)   Epic    Fear of Current or Ex-Partner: No    Emotionally Abused: No    Physically Abused: No    Sexually Abused: No  Depression (PHQ2-9): Low Risk (09/11/2024)   Depression (PHQ2-9)    PHQ-2 Score: 0  Alcohol Screen: Low Risk (09/11/2024)   Alcohol Screen    Last Alcohol Screening Score (AUDIT): 0  Housing: Unknown (09/11/2024)   Epic    Unable to Pay for Housing in the Last Year: No    Number of Times Moved in the Last Year: Not on file    Homeless in the Last Year: No  Utilities: Not At Risk (09/11/2024)   Epic    Threatened with loss of utilities: No  Health Literacy: Adequate Health Literacy (09/11/2024)   B1300 Health Literacy    Frequency of need for help with medical instructions: Never     Physical Exam   Vitals:   12/07/24 1938 12/08/24 0008  BP: 130/66   Pulse:    Resp:    Temp:  99.1 F (37.3 C)  SpO2:      CONSTITUTIONAL: Well-appearing, NAD NEURO/PSYCH:  Alert and oriented x 3, no focal deficits EYES:  eyes equal and reactive ENT/NECK:  no LAD, no JVD CARDIO: Regular rate, well-perfused, normal S1 and S2 PULM:  CTAB no wheezing or rhonchi GI/GU:  non-distended, non-tender MSK/SPINE:  No gross deformities, no edema SKIN:  no rash, atraumatic   *Additional and/or pertinent findings included in MDM below  Diagnostic and Interventional Summary    EKG Interpretation Date/Time:    Ventricular Rate:    PR Interval:    QRS Duration:    QT Interval:    QTC Calculation:   R Axis:      Text Interpretation:         Labs Reviewed - No data to display  LE VENOUS    (Results Pending)    Medications  oxyCODONE  (Oxy IR/ROXICODONE ) immediate release tablet 5 mg (5 mg Oral Given 12/08/24  0021)     Procedures  /  Critical Care Procedures  ED Course and Medical Decision Making  Initial Impression and Ddx Normal-appearing leg with no erythema or edema or increased warmth.  Seems to be neurovascularly intact distally, normal strength and sensation, cap refill to the toes reassuring, definitely no hard signs of ischemia.  Patient's description could be MSK, could be claudication, could be calf DVT as the main differential diagnosis.  Per chart review, he had a CTA done today showing pretty extensive peripheral artery disease and full occlusion of many blood vessels leading to this leg.  Despite this it does not appear that he has an emergent vascular process based on the exam but given the severity of the CT read will discuss with vascular surgery, at the very least he needs very prompt vascular surgery follow-up.  Past medical/surgical history that increases complexity of ED encounter: Peripheral artery disease  Interpretation of Diagnostics Laboratory and/or imaging options to aid in the diagnosis/care of the patient were considered.  After careful history and physical examination, it was determined that there was no indication for diagnostics at this time.  Patient Reassessment and Ultimate Disposition/Management     Case discussed with Dr. Serene of vascular surgery, we are in agreement that there is no need for emergent intervention and patient can follow-up in the office.  Patient and family are agreeable with this plan, return precautions discussed, they will return for ultrasound later in the morning.  Patient management required discussion with the following services or consulting groups:  None  Complexity of Problems Addressed Acute illness or injury that poses threat of life of bodily function  Additional Data Reviewed and Analyzed Further history obtained from: Further history from spouse/family member  Additional Factors Impacting ED Encounter  Risk Consideration of hospitalization  Ozell HERO. Theadore, MD [facility] Emergency Medicine [facility] [email or web address]  Final Clinical Impressions(s) / ED Diagnoses     ICD-10-CM   1. Right calf pain  M79.661     2. Peripheral artery disease  I73.9       ED Discharge Orders          Ordered    LE VENOUS        12/08/24 0036             Discharge Instructions Discussed with and Provided to Patient:     Discharge Instructions      You were evaluated in the Emergency Department and after careful evaluation, we did not find any emergent condition requiring admission or further testing in the hospital.  Your exam/testing today was overall reassuring.  Symptoms may be due to your peripheral artery disease.  We discussed your case with the vascular surgeons, recommend follow-up with Dr. Serene over the next few days.  Call the office number to set up a formal appointment.  We also recommend returning for ultrasound later this morning as we discussed.  Continue your tramadol  at home.  Please return to the Emergency Department if you experience any worsening of your condition.  Thank you for allowing us  to be a part of your care.        Theadore Ozell HERO, MD 12/08/24 [phone]  "

## 2024-12-08 NOTE — Progress Notes (Signed)
 VASCULAR LAB    Right lower extremity venous duplex has been performed.  See CV proc for preliminary results.   Zackerie Sara, RVT 12/08/2024, 3:22 PM

## 2024-12-08 NOTE — Discharge Instructions (Addendum)
 You were evaluated in the Emergency Department and after careful evaluation, we did not find any emergent condition requiring admission or further testing in the hospital.  Your exam/testing today was overall reassuring.  Symptoms may be due to your peripheral artery disease.  We discussed your case with the vascular surgeons, recommend follow-up with Dr. Serene over the next few days.  Call the office number to set up a formal appointment.  We also recommend returning for ultrasound later this morning as we discussed.  Continue your tramadol  at home.  Please return to the Emergency Department if you experience any worsening of your condition.  Thank you for allowing us  to be a part of your care.

## 2024-12-10 ENCOUNTER — Ambulatory Visit: Payer: Self-pay

## 2024-12-10 ENCOUNTER — Telehealth: Payer: Self-pay

## 2024-12-10 ENCOUNTER — Ambulatory Visit: Admitting: Family Medicine

## 2024-12-10 VITALS — BP 136/82 | HR 76 | Wt 181.4 lb

## 2024-12-10 DIAGNOSIS — I70201 Unspecified atherosclerosis of native arteries of extremities, right leg: Secondary | ICD-10-CM

## 2024-12-10 DIAGNOSIS — M79661 Pain in right lower leg: Secondary | ICD-10-CM

## 2024-12-10 DIAGNOSIS — I739 Peripheral vascular disease, unspecified: Secondary | ICD-10-CM | POA: Diagnosis not present

## 2024-12-10 MED ORDER — CLOPIDOGREL BISULFATE 75 MG PO TABS: 75.0000 mg | ORAL_TABLET | Freq: Every day | ORAL | 1 refills | Status: AC

## 2024-12-10 MED ORDER — TRAMADOL HCL 50 MG PO TABS: 100.0000 mg | ORAL_TABLET | Freq: Three times a day (TID) | ORAL | 0 refills | Status: AC | PRN

## 2024-12-10 MED ORDER — HYDROCODONE-ACETAMINOPHEN 5-325 MG PO TABS: 1.0000 | ORAL_TABLET | Freq: Four times a day (QID) | ORAL | 0 refills | Status: AC | PRN

## 2024-12-10 NOTE — Patient Instructions (Signed)
 VISIT SUMMARY:  During your visit, we discussed the pain and numbness in your right leg, which has worsened since your last visit. We also addressed your constipation related to opioid use.  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 We have increased your tramadol  dosage to 100 mg every 8 hours and prescribed Norco for additional pain relief. It is important to pick up and start taking clopidogrel  as prescribed. Apply heat to the affected area and avoid using ice. We have also expedited your referral to a vascular surgeon to address this issue further.  -CONSTIPATION SECONDARY TO OPIOID THERAPY: Your constipation is a side effect of the opioid medications you are taking for pain. Continue taking Betadex and consider using Metamucil or Miralax daily with water. Ensure you drink plenty of water to help manage this condition.  INSTRUCTIONS:  Please follow up with the vascular surgeon as soon as possible. Ensure you pick up and start taking clopidogrel  as prescribed. Continue with the increased tramadol  dosage and use Norco for additional pain relief as needed. Maintain your fiber intake and hydration to manage constipation.

## 2024-12-10 NOTE — Telephone Encounter (Signed)
 Pt appt scheduled.

## 2024-12-10 NOTE — Telephone Encounter (Signed)
" °  FYI Only or Action Required?: FYI only for provider: appointment scheduled on today .  Patient was last seen in primary care on 12/06/2024 by Vita Morrow, MD.  Called Nurse Triage reporting Leg Pain.  Symptoms began 2 weeks ago.  Interventions attempted: Prescription medications: Tramadol .  Symptoms are: gradually worsening.  Triage Disposition: See Physician Within 24 Hours  Patient/caregiver understands and will follow disposition?: yes       Message from Hadassah PARAS sent at 12/10/2024 10:37 AM EST  Reason for Triage: Pt is experiencing numbess and pain from calf down to feet. Pt has an appointment scheduled with a specialist two weeks out and is wanting relief sooner than that.   Reason for Disposition  Numbness in a leg or foot (i.e., loss of sensation)  Answer Assessment - Initial Assessment Questions Pt has been evaluated in office and in ED/needs better pain relief until he sees specialist in 2 weeks.     1. ONSET: When did the pain start?      2 weeks 2. LOCATION: Where is the pain located?      Right lower leg to foot when walking /numbness at rest  3. PAIN: How bad is the pain?    (Scale 1-10; or mild, moderate, severe)     When walking unable to bear as much weight develops severe pain  4. WORK OR EXERCISE: Has there been any recent work or exercise that involved this part of the body?      no 5. CAUSE: What do you think is causing the leg pain?     Artery disease arteries smaller 6. OTHER SYMPTOMS: Do you have any other symptoms? (e.g., chest pain, back pain, breathing difficulty, swelling, rash, fever, numbness, weakness)     Numbness calf to foot to right lower extremity, cramping, limping  Protocols used: Leg Pain-A-AH  "

## 2024-12-10 NOTE — Progress Notes (Signed)
 "  Name: Joseph West   Date of Visit: 12/10/24   Date of last visit with me: 12/06/2024   CHIEF COMPLAINT:  Chief Complaint  Patient presents with   Acute Visit    Right lower leg pain, feels pain and is experiencing numbness as well. Pain is to the lateral side of lower leg.         HPI:  Discussed the use of AI scribe software for clinical note transcription with the patient, who gave verbal consent to proceed.  History of Present Illness   Joseph West is an 84 year old male who presents with pain and numbness in the leg.  He experiences pain and numbness in his leg, particularly noticeable upon waking in the morning. The numbness extends from the knee down. This issue has worsened since his last visit a few days ago.  He has been taking tramadol  for pain relief, prescribed at a dose of 50 mg every eight hours, but it has not been effective. He has not been taking it consistently every eight hours. He also mentions using a blood pressure medication, though it is not specified if this is related to his current symptoms.  An ultrasound was performed in the emergency room on a recent Saturday. He has not yet picked up a prescription for clopidogrel , a blood thinner, which was intended to help with his condition.  No previous episodes of numbness or tingling in his legs before this current issue. He has been performing exercises, but notes that the pain began after starting these exercises.  He takes a fiber supplement from a health food store to manage constipation, which he experiences as a side effect of his medications. He drinks plenty of water to aid in the effectiveness of the fiber supplement.         OBJECTIVE:       09/11/2024   10:16 AM  Depression screen PHQ 2/9  Decreased Interest 0  Down, Depressed, Hopeless 0  PHQ - 2 Score 0  Altered sleeping 0  Tired, decreased energy 0  Change in appetite 0  Feeling bad or failure about yourself  0  Trouble  concentrating 0  Moving slowly or fidgety/restless 0  Suicidal thoughts 0  PHQ-9 Score 0   Difficult doing work/chores Not difficult at all     Data saved with a previous flowsheet row definition     BP Readings from Last 3 Encounters:  12/10/24 136/82  12/07/24 130/66  12/06/24 120/78    BP 136/82   Pulse 76   Wt 181 lb 6.4 oz (82.3 kg)   SpO2 99%   BMI 25.30 kg/m    Physical Exam   MUSCULOSKELETAL: Pain with flexion or extension of the elbow. Full range of motion in the elbow.      Physical Exam Constitutional:      Appearance: Normal appearance.  Skin:    Comments: Cool lower extremity on right side, no posterior tibial or dp pulses. Decrease sensations of right side.   Neurological:     General: No focal deficit present.     Mental Status: He is alert and oriented to person, place, and time. Mental status is at baseline.     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 Tramadol  ineffective.  Clopidogrel  not yet started. Goal: manage pain, prevent further occlusion, expedite vascular surgery consultation. - Increased tramadol  to 100 mg every 8 hours. - Prescribed Norco for additional pain relief. - Ensured clopidogrel  pickup and adherence. - Advised heat application, avoid icing. - Expedited vascular surgery referral.  Constipation secondary to opioid therapy Constipation due to opioid use. Betadex effective. Emphasized fiber intake and hydration. - Continue Betadex. - Advised Metamucil or Miralax daily with water. - Ensure adequate hydration.         Annaleia Pence A. Vita MD [facility] "

## 2024-12-12 ENCOUNTER — Other Ambulatory Visit (HOSPITAL_COMMUNITY): Payer: Self-pay

## 2024-12-12 ENCOUNTER — Telehealth: Payer: Self-pay | Admitting: Pharmacy Technician

## 2024-12-12 ENCOUNTER — Other Ambulatory Visit: Payer: Self-pay | Admitting: Surgery

## 2024-12-12 DIAGNOSIS — I739 Peripheral vascular disease, unspecified: Secondary | ICD-10-CM

## 2024-12-12 NOTE — Telephone Encounter (Signed)
 Pharmacy Patient Advocate Encounter   Received notification from [facility] that prior authorization for Tramadol  50 mg tabs is required/requested.   Insurance verification completed.   The patient is insured through [employer].   Per test claim: The current 15 day co-pay is, $2.55.  No PA needed at this time. This test claim was processed through [facility]- copay amounts may vary at other pharmacies due to pharmacy/plan contracts, or as the patient moves through the different stages of their insurance plan.

## 2024-12-13 ENCOUNTER — Other Ambulatory Visit (HOSPITAL_COMMUNITY): Payer: Self-pay

## 2024-12-21 MED ORDER — BUPIVACAINE HCL 0.25 % IJ SOLN
2.0000 mL | Freq: Once | INTRAMUSCULAR | Status: AC
  Administered 2024-12-21: 2 mL

## 2024-12-21 MED ORDER — CITALOPRAM HYDROBROMIDE 10 MG PO TABS: 10.0000 mg | ORAL_TABLET | Freq: Every day | ORAL | 3 refills | Status: AC

## 2024-12-21 MED ORDER — LIDOCAINE HCL 1 % IJ SOLN
5.0000 mL | Freq: Once | INTRAMUSCULAR | Status: AC
  Administered 2024-12-21: 5 mL via INTRADERMAL

## 2024-12-21 NOTE — Addendum Note (Signed)
 Addended by: LATTIE CARLO BROCKS on: 12/21/2024 10:04 AM   Modules accepted: Orders

## 2024-12-24 ENCOUNTER — Ambulatory Visit: Admitting: Surgery

## 2024-12-24 ENCOUNTER — Other Ambulatory Visit (HOSPITAL_COMMUNITY): Payer: Self-pay

## 2024-12-24 ENCOUNTER — Ambulatory Visit (HOSPITAL_COMMUNITY): Admission: RE | Admit: 2024-12-24 | Discharge: 2024-12-24 | Attending: Surgery | Admitting: Surgery

## 2024-12-24 ENCOUNTER — Encounter: Payer: Self-pay | Admitting: Surgery

## 2024-12-24 VITALS — BP 132/78 | HR 79 | Temp 97.8°F | Ht 71.0 in | Wt 181.0 lb

## 2024-12-24 DIAGNOSIS — I739 Peripheral vascular disease, unspecified: Secondary | ICD-10-CM

## 2024-12-24 DIAGNOSIS — I70213 Atherosclerosis of native arteries of extremities with intermittent claudication, bilateral legs: Secondary | ICD-10-CM | POA: Diagnosis not present

## 2024-12-24 DIAGNOSIS — I7143 Infrarenal abdominal aortic aneurysm, without rupture: Secondary | ICD-10-CM

## 2024-12-24 LAB — VAS US ABI WITH/WO TBI
Left ABI: 0.62
Right ABI: 0.13

## 2024-12-24 MED ORDER — CILOSTAZOL 100 MG PO TABS
100.0000 mg | ORAL_TABLET | Freq: Two times a day (BID) | ORAL | 11 refills | Status: AC
  Filled 2024-12-24: qty 60, 30d supply, fill #0

## 2024-12-25 ENCOUNTER — Other Ambulatory Visit: Payer: Self-pay | Admitting: *Deleted

## 2024-12-25 DIAGNOSIS — R0989 Other specified symptoms and signs involving the circulatory and respiratory systems: Secondary | ICD-10-CM

## 2024-12-25 DIAGNOSIS — I70213 Atherosclerosis of native arteries of extremities with intermittent claudication, bilateral legs: Secondary | ICD-10-CM

## 2025-01-18 ENCOUNTER — Ambulatory Visit (HOSPITAL_COMMUNITY)

## 2025-01-21 ENCOUNTER — Ambulatory Visit: Admitting: Surgery

## 2025-02-28 ENCOUNTER — Ambulatory Visit: Payer: Self-pay | Admitting: Family Medicine
